# Patient Record
Sex: Female | Born: 1980 | Race: White | Hispanic: No | Smoking: Never smoker
Health system: Southern US, Community
[De-identification: ages and names within clinical notes are randomized; demographics above are authoritative.]

## PROBLEM LIST (undated history)

## (undated) DIAGNOSIS — M1990 Unspecified osteoarthritis, unspecified site: Secondary | ICD-10-CM

## (undated) DIAGNOSIS — F32A Depression, unspecified: Secondary | ICD-10-CM

## (undated) DIAGNOSIS — Z8619 Personal history of other infectious and parasitic diseases: Secondary | ICD-10-CM

## (undated) DIAGNOSIS — Z9889 Other specified postprocedural states: Secondary | ICD-10-CM

## (undated) DIAGNOSIS — T4145XA Adverse effect of unspecified anesthetic, initial encounter: Secondary | ICD-10-CM

## (undated) DIAGNOSIS — T8859XA Other complications of anesthesia, initial encounter: Secondary | ICD-10-CM

## (undated) DIAGNOSIS — R011 Cardiac murmur, unspecified: Secondary | ICD-10-CM

## (undated) DIAGNOSIS — F329 Major depressive disorder, single episode, unspecified: Secondary | ICD-10-CM

## (undated) DIAGNOSIS — R112 Nausea with vomiting, unspecified: Secondary | ICD-10-CM

## (undated) DIAGNOSIS — F419 Anxiety disorder, unspecified: Secondary | ICD-10-CM

## (undated) DIAGNOSIS — G4700 Insomnia, unspecified: Secondary | ICD-10-CM

## (undated) HISTORY — DX: Anxiety disorder, unspecified: F41.9

## (undated) HISTORY — DX: Insomnia, unspecified: G47.00

## (undated) HISTORY — DX: Depression, unspecified: F32.A

## (undated) HISTORY — DX: Major depressive disorder, single episode, unspecified: F32.9

## (undated) HISTORY — DX: Personal history of other infectious and parasitic diseases: Z86.19

## (undated) HISTORY — DX: Cardiac murmur, unspecified: R01.1

## (undated) HISTORY — DX: Unspecified osteoarthritis, unspecified site: M19.90

## (undated) HISTORY — PX: OTHER SURGICAL HISTORY: SHX169

---
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
## 2001-08-02 ENCOUNTER — Inpatient Hospital Stay (HOSPITAL_COMMUNITY): Admission: AD | Admit: 2001-08-02 | Discharge: 2001-08-02 | Payer: Self-pay | Admitting: *Deleted

## 2001-11-02 ENCOUNTER — Emergency Department (HOSPITAL_COMMUNITY): Admission: EM | Admit: 2001-11-02 | Discharge: 2001-11-02 | Payer: Self-pay | Admitting: Emergency Medicine

## 2001-11-02 ENCOUNTER — Encounter: Payer: Self-pay | Admitting: Emergency Medicine

## 2004-02-16 ENCOUNTER — Emergency Department (HOSPITAL_COMMUNITY): Admission: EM | Admit: 2004-02-16 | Discharge: 2004-02-16 | Payer: Self-pay | Admitting: Emergency Medicine

## 2006-06-18 ENCOUNTER — Inpatient Hospital Stay (HOSPITAL_COMMUNITY): Admission: AD | Admit: 2006-06-18 | Discharge: 2006-06-18 | Payer: Self-pay | Admitting: Gynecology

## 2009-11-21 ENCOUNTER — Inpatient Hospital Stay (HOSPITAL_COMMUNITY): Admission: AD | Admit: 2009-11-21 | Discharge: 2009-11-21 | Payer: Self-pay | Admitting: Obstetrics and Gynecology

## 2009-11-21 ENCOUNTER — Ambulatory Visit: Payer: Self-pay | Admitting: Family

## 2009-12-01 ENCOUNTER — Inpatient Hospital Stay (HOSPITAL_COMMUNITY): Admission: RE | Admit: 2009-12-01 | Discharge: 2009-12-01 | Payer: Self-pay | Admitting: Obstetrics and Gynecology

## 2009-12-02 ENCOUNTER — Inpatient Hospital Stay (HOSPITAL_COMMUNITY): Admission: AD | Admit: 2009-12-02 | Discharge: 2009-12-05 | Payer: Self-pay | Admitting: Obstetrics & Gynecology

## 2010-09-13 LAB — CBC
HCT: 30.5 % — ABNORMAL LOW (ref 36.0–46.0)
HCT: 30.7 % — ABNORMAL LOW (ref 36.0–46.0)
HCT: 37.6 % (ref 36.0–46.0)
Hemoglobin: 13.1 g/dL (ref 12.0–15.0)
MCHC: 34.8 g/dL (ref 30.0–36.0)
MCHC: 35.4 g/dL (ref 30.0–36.0)
MCV: 85.9 fL (ref 78.0–100.0)
MCV: 86.2 fL (ref 78.0–100.0)
MCV: 86.8 fL (ref 78.0–100.0)
Platelets: 113 10*3/uL — ABNORMAL LOW (ref 150–400)
Platelets: 127 10*3/uL — ABNORMAL LOW (ref 150–400)
Platelets: 129 10*3/uL — ABNORMAL LOW (ref 150–400)
Platelets: 99 10*3/uL — ABNORMAL LOW (ref 150–400)
RBC: 3.54 MIL/uL — ABNORMAL LOW (ref 3.87–5.11)
RBC: 4.38 MIL/uL (ref 3.87–5.11)
RDW: 13.7 % (ref 11.5–15.5)
RDW: 13.9 % (ref 11.5–15.5)
WBC: 10.4 10*3/uL (ref 4.0–10.5)
WBC: 14.1 10*3/uL — ABNORMAL HIGH (ref 4.0–10.5)
WBC: 7.5 10*3/uL (ref 4.0–10.5)

## 2010-09-13 LAB — RPR: RPR Ser Ql: NONREACTIVE

## 2010-12-16 ENCOUNTER — Encounter: Payer: Self-pay | Admitting: Family Medicine

## 2012-10-18 ENCOUNTER — Encounter: Payer: Self-pay | Admitting: Family Medicine

## 2012-10-18 DIAGNOSIS — R011 Cardiac murmur, unspecified: Secondary | ICD-10-CM | POA: Insufficient documentation

## 2012-10-19 ENCOUNTER — Ambulatory Visit (INDEPENDENT_AMBULATORY_CARE_PROVIDER_SITE_OTHER): Admitting: Physician Assistant

## 2012-10-19 ENCOUNTER — Encounter: Payer: Self-pay | Admitting: Physician Assistant

## 2012-10-19 VITALS — BP 114/66 | HR 64 | Temp 97.8°F | Resp 18 | Ht 64.0 in | Wt 134.0 lb

## 2012-10-19 DIAGNOSIS — R5381 Other malaise: Secondary | ICD-10-CM

## 2012-10-19 DIAGNOSIS — M238X1 Other internal derangements of right knee: Secondary | ICD-10-CM

## 2012-10-19 DIAGNOSIS — R29898 Other symptoms and signs involving the musculoskeletal system: Secondary | ICD-10-CM

## 2012-10-19 DIAGNOSIS — L909 Atrophic disorder of skin, unspecified: Secondary | ICD-10-CM

## 2012-10-19 DIAGNOSIS — L918 Other hypertrophic disorders of the skin: Secondary | ICD-10-CM

## 2012-10-19 DIAGNOSIS — R5383 Other fatigue: Secondary | ICD-10-CM

## 2012-10-19 DIAGNOSIS — M25569 Pain in unspecified knee: Secondary | ICD-10-CM

## 2012-10-20 LAB — CBC WITH DIFFERENTIAL/PLATELET
Basophils Relative: 1 % (ref 0–1)
HCT: 34.9 % — ABNORMAL LOW (ref 36.0–46.0)
Hemoglobin: 11.6 g/dL — ABNORMAL LOW (ref 12.0–15.0)
Lymphocytes Relative: 32 % (ref 12–46)
Lymphs Abs: 2.5 10*3/uL (ref 0.7–4.0)
MCHC: 33.2 g/dL (ref 30.0–36.0)
Monocytes Absolute: 0.4 10*3/uL (ref 0.1–1.0)
Monocytes Relative: 5 % (ref 3–12)
Neutro Abs: 4.7 10*3/uL (ref 1.7–7.7)
Neutrophils Relative %: 59 % (ref 43–77)
RBC: 4.42 MIL/uL (ref 3.87–5.11)

## 2012-10-20 LAB — COMPLETE METABOLIC PANEL WITH GFR
Albumin: 3.8 g/dL (ref 3.5–5.2)
BUN: 11 mg/dL (ref 6–23)
CO2: 27 mEq/L (ref 19–32)
Calcium: 8.5 mg/dL (ref 8.4–10.5)
Chloride: 106 mEq/L (ref 96–112)
GFR, Est Non African American: >89 mL/min
Glucose, Bld: 95 mg/dL (ref 70–99)
Potassium: 4.3 mEq/L (ref 3.5–5.3)
Sodium: 139 mEq/L (ref 135–145)
Total Protein: 6.2 g/dL (ref 6.0–8.3)

## 2012-10-20 LAB — TSH: TSH: 1.201 u[IU]/mL (ref 0.350–4.500)

## 2012-10-20 NOTE — Progress Notes (Signed)
Patient ID: Jeanne Lamb MRN: [id number], DOB: 1980-10-05, 32 y.o. Date of Encounter: 10/19/2012  Chief Complaint:  Chief Complaint  Patient presents with  . c/o bad dry skin  suspicious lesion on back  . creaky knees and crepitous  . still nursing infant son    HPI: 32 y.o. year old female  presents with multiple concerns:  1- Mole on back has gotten larger and is irritating. In past lived in [place] and had high sun exposure. Wants to check lesion  2- Right knee makes 'rice krispy' sound. Has no pain. No locking up, giving way.   3- Both calves feel achy when she lies on couch in the evening to rest. Has no cramping, just 'achy like growing pains'   4- Feels exhausted all the time despite sleeping 8 hours at night, eating very healthy, and exercising every day.  Also, doesnot understand why her weight does not decrease no matter what she does. Used to weigh 120.  5- Has h/o problem with left ear. Saw Dr. Modesto Charon here twice-treated with antibiotics x 2. Then went to ENT-'infection was gone but fluid was behind ear. They had me blow out and use bite guard." Then, say Dr Tanya Nones also. He prescribed another antibiotic. Says she just started noticing that same popping sound in left ear again and wants to check it.   Past Medical History  Diagnosis Date  . Depression   . Anxiety   . History of chicken pox   . Insomnia   . Arthritis     joint pains  . Heart murmur      Home Meds:See attached medication section for Meds entered today Current Outpatient Prescriptions on File Prior to Visit  Medication Sig Dispense Refill  . cefdinir (OMNICEF) 300 MG capsule Take 300 mg by mouth 2 (two) times daily.         No current facility-administered medications on file prior to visit.    Allergies:  Allergies  Allergen Reactions  . Minocycline     History   Social History  . Marital Status: Married    Spouse Name: N/A    Number of Children: N/A  . Years of Education: N/A    Occupational History  . Not on file.   Social History Main Topics  . Smoking status: Never Smoker   . Smokeless tobacco: Never Used  . Alcohol Use: No  . Drug Use: No  . Sexually Active: Not on file   Other Topics Concern  . Not on file   Social History Narrative  . No narrative on file    Family History  Problem Relation Age of Onset  . Heart disease Mother     MI  . Cancer Maternal Grandmother     Hodgkins Dz     Review of Systems: Constitutional: negative for chills, fever, night sweats. Positive; Weight change and fatigue  HEENT: negative for vision changes, hearing loss, congestion, rhinorrhea, ST, epistaxis, or sinus pressure Cardiovascular: negative for chest pain or palpitations. No new/increased shortness of breath or dyspnea on exertion Respiratory: negative for hemoptysis, wheezing, shortness of breath, or cough Abdominal: negative for abdominal pain, nausea, vomiting, diarrhea, or constipation Dermatological: negative for rash. Positive for concerning skin lesions Neurologic: negative for headache, dizziness, or syncope All other systems reviewed and are otherwise negative with the exception to those above and in the HPI.   Physical Exam: Blood pressure 114/66, pulse 64, temperature 97.8 F (36.6 C), temperature source Oral, resp. rate  18, height 5\' 4"  (1.626 m), weight 134 lb (60.782 kg), last menstrual period 09/25/2012., Body mass index is 22.99 kg/(m^2). General: Well developed, well nourished,WF in no acute distress. Head: Normocephalic, atraumatic, eyes without discharge, sclera non-icteric, nares are without discharge. Bilateral auditory canals clear, TM's are without perforation, pearly grey and translucent with reflective cone of light bilaterally. Oral cavity moist, posterior pharynx without exudate, erythema, peritonsillar abscess, or post nasal drip.  Neck: Supple. No thyromegaly. Full ROM. No lymphadenopathy. Lungs: Clear bilaterally to  auscultation without wheezes, rales, or rhonchi. Breathing is unlabored. Heart: RRR with S1 S2. No murmurs, rubs, or gallops. Musculoskeletal:  Strength and tone normal for age. Right knee; Inspection nml. Palpation of entire knee causes no tenderness. No laxity with valgus, varus. Negative grind test.  Extremities/Skin: Warm and dry. On her back at approx T4 level, there is a small nevus/skin tag.  Neuro: Alert and oriented X 3. Moves all extremities spontaneously. Gait is normal. CNII-XII grossly in tact. Psych:  Responds to questions appropriately with a normal affect.      ASSESSMENT AND PLAN:  32 y.o. year old female with  1. Skin tag I told her this does not appear to be c/w any type of skin cancer. She still wants it resected b/c it is irritating.She is to schedule a f/u appt to have this resected.   2. Other malaise and fatigue She ahs two small children. She does P90X every day. These may be factors of her fatigue, but will check labs to r/o underlying medical causes. - CBC with Differential - COMPLETE METABOLIC PANEL WITH GFR - TSH  3. Pain in joint, lower leg, unspecified laterality Her symptoms sound like they may be secondary to muscle fatigue with P90X. Will check potassium, calcium, thyroid etc - COMPLETE METABOLIC PANEL WITH GFR  4. Knee crepitus, right  Reassured her. She says this has been present and unchanged for years  5. Popping in left ear; Reassured her that exam is normal. Recommend decongestant.   Murray Hodgkins [place], [place], [facility] 10/20/2012 9:04 PM

## 2012-11-01 ENCOUNTER — Ambulatory Visit (INDEPENDENT_AMBULATORY_CARE_PROVIDER_SITE_OTHER): Admitting: Family Medicine

## 2012-11-01 ENCOUNTER — Encounter: Payer: Self-pay | Admitting: Family Medicine

## 2012-11-01 VITALS — BP 98/62 | HR 60 | Temp 98.1°F | Resp 14 | Wt 131.0 lb

## 2012-11-01 DIAGNOSIS — D239 Other benign neoplasm of skin, unspecified: Secondary | ICD-10-CM

## 2012-11-01 DIAGNOSIS — D229 Melanocytic nevi, unspecified: Secondary | ICD-10-CM

## 2012-11-01 NOTE — Progress Notes (Signed)
  Subjective:    Patient ID: Jeanne Lamb, female    DOB: [birth year], 31 y.o.   MRN: [id number]  HPI  Patient has a mole on her back that she requests removal. She states it is growing. He becomes frequently irritated by her bra strap. She like a biopsy.  She has no history of skin cancer. She has no family history of melanoma. Mole is possibly 4 mm. It is pink to brown in color. There are no concerning features. Past Medical History  Diagnosis Date  . Depression   . Anxiety   . History of chicken pox   . Insomnia   . Arthritis     joint pains  . Heart murmur    No current outpatient prescriptions on file prior to visit.   No current facility-administered medications on file prior to visit.   Allergies  Allergen Reactions  . Minocycline      Review of Systems  All other systems reviewed and are negative.       Objective:   Physical Exam  Cardiovascular: Normal rate and regular rhythm.   Murmur heard. Pulmonary/Chest: Effort normal and breath sounds normal. No respiratory distress. She has no wheezes. She has no rales.   4 mm pink mole in the center of her back        Assessment & Plan:  1. Nevus Reassured the patient I did not feel this cancers. She still requests excision due to irritation. Area was anesthetized with 0.1% lidocaine with epinephrine. A shave biopsy was then performed. Hemostasis was achieved with Drysol. The lesion was sent to pathology and labeled container.

## 2012-11-14 ENCOUNTER — Encounter: Payer: Self-pay | Admitting: Family Medicine

## 2012-11-21 ENCOUNTER — Encounter: Payer: Self-pay | Admitting: Family Medicine

## 2013-01-07 ENCOUNTER — Ambulatory Visit (INDEPENDENT_AMBULATORY_CARE_PROVIDER_SITE_OTHER): Admitting: Physician Assistant

## 2013-01-07 ENCOUNTER — Encounter: Payer: Self-pay | Admitting: Physician Assistant

## 2013-01-07 VITALS — BP 114/70 | HR 68 | Temp 98.4°F | Resp 18 | Wt 129.0 lb

## 2013-01-07 DIAGNOSIS — J322 Chronic ethmoidal sinusitis: Secondary | ICD-10-CM

## 2013-01-07 MED ORDER — AMOXICILLIN-POT CLAVULANATE 875-125 MG PO TABS: 1.0000 | ORAL_TABLET | Freq: Two times a day (BID) | ORAL | Status: DC

## 2013-01-07 NOTE — Progress Notes (Signed)
   Patient ID: Jeanne Lamb MRN: [id number], DOB: May 20, 1981, 32 y.o. Date of Encounter: 01/07/2013, 4:00 PM    Chief Complaint:  Chief Complaint  Patient presents with  . c/o sore throat, sinusitis x 5 weeks  now with chest cong, s     HPI: 32 y.o. year old white female reports that symptoms started 5 weeks ago. Thought it was allergies but it is persisting/getting worse. Had sore throat for 2 days. Then developed sinus pressure/congestion. Nasal drainage, sneezing, productive cough. Nose burns at times. Pain in left cheek. She gets out only small amount of mucus from nose but it is very thick. Also phlegm is very thick.    Home Meds: See attached medication section for any medications that were entered at today's visit. The computer does not put those onto this list.The following list is a list of meds entered prior to today's visit.   No current outpatient prescriptions on file prior to visit.   No current facility-administered medications on file prior to visit.    Allergies:  Allergies  Allergen Reactions  . Minocycline       Review of Systems: See HPI for pertinent ROS. All other ROS negative.    Physical Exam: Blood pressure 114/70, pulse 68, temperature 98.4 F (36.9 C), temperature source Oral, resp. rate 18, weight 129 lb (58.514 kg)., Body mass index is 22.13 kg/(m^2). General: WNWD WF.  Appears in no acute distress. HEENT: Normocephalic, atraumatic, eyes without discharge, sclera non-icteric, nares are without discharge. Bilateral auditory canals clear, TM's are without perforation, pearly grey and translucent with reflective cone of light bilaterally. Oral cavity moist, posterior pharynx without exudate, erythema, peritonsillar abscess. Left maxillary sinus is tender with percussion.Minimal tenderness with percussion of right maxillary and frontal sinuses. Neck: Supple. No thyromegaly. No lymphadenopathy. Lungs: Clear bilaterally to auscultation without wheezes,  rales, or rhonchi. Breathing is unlabored. Heart: Regular rhythm. I/VI murmur Msk:  Strength and tone normal for age. Extremities/Skin: Warm and dry. No clubbing or cyanosis. No edema. No rashes or suspicious lesions. Neuro: Alert and oriented X 3. Moves all extremities spontaneously. Gait is normal. CNII-XII grossly in tact. Psych:  Responds to questions appropriately with a normal affect.     ASSESSMENT AND PLAN:  32 y.o. year old female with  1. Ethmoid sinusitis - amoxicillin-clavulanate (AUGMENTIN) 875-125 MG per tablet; Take 1 tablet by mouth 2 (two) times daily.  Dispense: 28 tablet; Refill: 0 Mucinex DM expectorant. Also, decongestant prn. F/U if does not resolve.  Murray Hodgkins [place], [place], [facility] 01/07/2013 4:00 PM

## 2013-02-04 ENCOUNTER — Encounter: Payer: Self-pay | Admitting: Family Medicine

## 2013-02-04 ENCOUNTER — Ambulatory Visit (INDEPENDENT_AMBULATORY_CARE_PROVIDER_SITE_OTHER): Admitting: Family Medicine

## 2013-02-04 VITALS — BP 100/70 | HR 68 | Temp 97.0°F | Resp 16 | Wt 134.0 lb

## 2013-02-04 DIAGNOSIS — F32A Depression, unspecified: Secondary | ICD-10-CM

## 2013-02-04 DIAGNOSIS — F3289 Other specified depressive episodes: Secondary | ICD-10-CM

## 2013-02-04 DIAGNOSIS — F329 Major depressive disorder, single episode, unspecified: Secondary | ICD-10-CM

## 2013-02-04 DIAGNOSIS — F411 Generalized anxiety disorder: Secondary | ICD-10-CM

## 2013-02-04 DIAGNOSIS — G4700 Insomnia, unspecified: Secondary | ICD-10-CM

## 2013-02-04 MED ORDER — FLUOXETINE HCL 10 MG PO CAPS: 10.0000 mg | ORAL_CAPSULE | Freq: Every day | ORAL | Status: DC

## 2013-02-04 NOTE — Assessment & Plan Note (Signed)
Start prozac 10mg , discussed side effects, use of medication F/U 4 weeks to reasses

## 2013-02-04 NOTE — Assessment & Plan Note (Signed)
Start prozac, continue exercise routine

## 2013-02-04 NOTE — Patient Instructions (Signed)
Start prozac once a day in AM Call if you have any concerns F/U 4 weeks

## 2013-02-04 NOTE — Progress Notes (Signed)
  Subjective:    Patient ID: Jeanne Lamb, female    DOB: 1981/05/01, 32 y.o.   MRN: [id number]  HPI  Pt here with anxiety and depression symptoms,wants to start SSRI. Works as Public house manager, was in school to bridge to [employer], this has been put off until October. Has felt depressed since birth of her last son who is now 3. Her husband was deployed but has now retired from [employer] since June.She has no help caring for her 2 kids, both 3 and 6. She has no energy, feels she cant get off the couch, has no interest in any activites, does not want to play with her kids, feels hopeless. She wants to be active with her children and go back to school but feels a lot of weigh ton her shoulders. Denies crying episodes, works swing shift therefore does not sleep well.  History of depression back in 2006 when husband left for [place], was treated with meds short term at that time but can not remember what meds.  Works out on regular basis, eats mostly vegetarian  Seen a few months ago, normal bloodwork for fatigue  Review of Systems - per above  GEN- + fatigue, fever, weight loss,weakness, recent illness CVS- denies chest pain, palpitations RESP- denies SOB, cough, wheeze Neuro- denies headache, dizziness, syncope, seizure activity       Objective:   Physical Exam  GEN-NAD,alert and oriented x 3, well groomed Psych- good eye contact, a little anxious appearing, not depressed appearing, no hallucinations, no apparent SI, normal thought process  GAD 7- 13 PHQ-9- 19     Assessment & Plan:

## 2013-02-04 NOTE — Assessment & Plan Note (Signed)
Due to swing shift and depression, we will see how she does on SSRI first, may need to add something prn for sleep She agrees with plan

## 2013-03-06 ENCOUNTER — Ambulatory Visit: Admitting: Family Medicine

## 2013-03-12 ENCOUNTER — Encounter: Payer: Self-pay | Admitting: Family Medicine

## 2013-03-12 ENCOUNTER — Ambulatory Visit (INDEPENDENT_AMBULATORY_CARE_PROVIDER_SITE_OTHER): Admitting: Family Medicine

## 2013-03-12 VITALS — BP 100/60 | HR 78 | Temp 98.8°F | Resp 18 | Wt 131.0 lb

## 2013-03-12 DIAGNOSIS — F3289 Other specified depressive episodes: Secondary | ICD-10-CM

## 2013-03-12 DIAGNOSIS — F32A Depression, unspecified: Secondary | ICD-10-CM

## 2013-03-12 DIAGNOSIS — F329 Major depressive disorder, single episode, unspecified: Secondary | ICD-10-CM

## 2013-03-12 DIAGNOSIS — F411 Generalized anxiety disorder: Secondary | ICD-10-CM

## 2013-03-12 MED ORDER — FLUOXETINE HCL 10 MG PO CAPS: 10.0000 mg | ORAL_CAPSULE | Freq: Every day | ORAL | Status: DC

## 2013-03-12 NOTE — Progress Notes (Signed)
  Subjective:    Patient ID: Jeanne Lamb, female    DOB: 07-Apr-1981, 32 y.o.   MRN: [id number]  HPI  Patient here to followup anxiety and depression. At last visit 4 weeks ago she was started on fluoxetine 10 mg. She states her mood is much improved and she feels more settled however she has noticed that it decreases her appetite and she had a few vivid dreams which are now resolved. She has lost 2 pounds since the last visit but she is making herself eat on a regular basis. She's not want to change the medication because she feels well on this. She has a lot of stress at home including her husband who is suffering with PTSD from the [employer]. She also has a 28-year-old son who has a lot of emotional issues that she would like to have see a child psychologist. Her work schedule also makes it very difficult to handle the household think it her proper rest and sleep as she typically will work third shift.  Review of Systems - per above  GEN- denies fatigue, fever, weight loss,weakness, recent illness HEENT- denies eye drainage, change in vision, nasal discharge, CVS- denies chest pain, palpitations RESP- denies SOB, cough, wheeze ABD- denies N/V, change in stools, abd pain Neuro- denies headache, dizziness, syncope, seizure activity        Objective:   Physical Exam GEN-NAD,alert and oriented x 3 Psych- Normal affect and mood, not overly anxious or depressed, good eye contact, well groomed PHQ-9 score 6 , GAD score 1        Assessment & Plan:

## 2013-03-12 NOTE — Assessment & Plan Note (Signed)
Per above regarding medications 

## 2013-03-12 NOTE — Patient Instructions (Signed)
Continue prozac I will call with family and child psychologist  F/U 2 months

## 2013-03-12 NOTE — Assessment & Plan Note (Signed)
She is much improved regarding her overall anxiety. We discussed the side effects of the medications and she wishes to stay on the current dose of fluoxetine. We will followup via phone in 4 weeks and then the office in 2 months. If she continues to have problems with decreased appetite and loss of weight and we will switch the SSRI. I would try the Zoloft or Paxil next.

## 2013-03-26 ENCOUNTER — Encounter: Payer: Self-pay | Admitting: Family Medicine

## 2013-04-25 ENCOUNTER — Encounter: Payer: Self-pay | Admitting: Physician Assistant

## 2013-04-25 ENCOUNTER — Ambulatory Visit (INDEPENDENT_AMBULATORY_CARE_PROVIDER_SITE_OTHER): Admitting: Physician Assistant

## 2013-04-25 VITALS — BP 96/60 | HR 72 | Temp 98.2°F | Resp 18 | Wt 138.0 lb

## 2013-04-25 DIAGNOSIS — J329 Chronic sinusitis, unspecified: Secondary | ICD-10-CM

## 2013-04-25 MED ORDER — PREDNISONE 20 MG PO TABS: ORAL_TABLET | ORAL | Status: DC

## 2013-04-25 MED ORDER — AMOXICILLIN-POT CLAVULANATE 875-125 MG PO TABS: 1.0000 | ORAL_TABLET | Freq: Two times a day (BID) | ORAL | Status: DC

## 2013-04-25 NOTE — Progress Notes (Signed)
Patient ID: Jeanne Lamb MRN: [id number], DOB: 07/12/1980, 32 y.o. Date of Encounter: 04/25/2013, 11:16 AM    Chief Complaint:  Chief Complaint  Patient presents with  . recurrent vertigo    thick congestion in sinuses, ears hurt     HPI: 32 y.o. year old white female reports that she started feeling sick 6 days ago. Started to develop sinus pressure and nasal congestion. Had a sore throat for one day several days ago but that has improved. Feels a lot of pressure behind both of her ears. Thinks she has little bit of congestion in her upper chest and has coughed up some thick dark phlegm but most of that she thinks is from drainage. Has been taking Tylenol Sinus routinely. She notes that she had similar symptoms last year at this time. Says that she ended up seeing Dr. Modesto Charon  twice and was treated with 2 different antibiotics. After that she ended up having to go to the ENT. They really did not help much either. Says that they did audiometry testing and told her to pinch her nose and blow out. Says that she ultimately ended up coming back here and saw Dr. Tanya Nones and was treated with a different antibiotic which she says did clear up symptoms and it finally resolved.  Home Meds: See attached medication section for any medications that were entered at today's visit. The computer does not put those onto this list.The following list is a list of meds entered prior to today's visit.   Current Outpatient Prescriptions on File Prior to Visit  Medication Sig Dispense Refill  . FLUoxetine (PROZAC) 10 MG capsule Take 1 capsule (10 mg total) by mouth daily.  30 capsule  2   No current facility-administered medications on file prior to visit.    Allergies:  Allergies  Allergen Reactions  . Minocycline       Review of Systems: See HPI for pertinent ROS. All other ROS negative.    Physical Exam: Blood pressure 96/60, pulse 72, temperature 98.2 F (36.8 C), temperature source Oral, resp.  rate 18, weight 138 lb (62.596 kg)., Body mass index is 23.68 kg/(m^2). General:  WNWD WF. Appears in no acute distress. Sound very congested when she talks. HEENT: Normocephalic, atraumatic, eyes without discharge, sclera non-icteric, nares are without discharge. Bilateral auditory canals clear. Left TM is retracted (looks like crinkled 'saran wrap") Right Tm is not so retracted but I do see tiny bubble behind right TM.  Oral cavity moist, posterior pharynx without exudate, erythema, peritonsillar abscess, or post nasal drip.  Neck: Supple. No thyromegaly. No lymphadenopathy. Bilateral maxillary sinuses are tender with percussion. Lungs: Clear bilaterally to auscultation without wheezes, rales, or rhonchi. Breathing is unlabored. Heart: Regular rhythm. No murmurs, rubs, or gallops. Msk:  Strength and tone normal for age. Extremities/Skin: Warm and dry. No clubbing or cyanosis. No edema. No rashes or suspicious lesions. Neuro: Alert and oriented X 3. Moves all extremities spontaneously. Gait is normal. CNII-XII grossly in tact. Psych:  Responds to questions appropriately with a normal affect.     ASSESSMENT AND PLAN:  32 y.o. year old female with  1. Sinusitis Within the complicated course she had with the symptoms last year we'll go ahead and treat with prednisone taper in addition to antibiotic. As well a did review her chart and it was Augmentin that Dr. Tanya Nones prescribed which ultimately did resolve her symptoms at that time. She is to also continue over-the-counter decongestants. Follow up if does  not resolve. - amoxicillin-clavulanate (AUGMENTIN) 875-125 MG per tablet; Take 1 tablet by mouth 2 (two) times daily.  Dispense: 20 tablet; Refill: 0 - predniSONE (DELTASONE) 20 MG tablet; Take 3 daily for 2 days, then 2 daily for 2 days, then 1 daily for 2 days.  Dispense: 12 tablet; Refill: 0   Signed, [address] [place], [place], [facility] 04/25/2013 11:16 AM

## 2013-07-01 ENCOUNTER — Encounter: Payer: Self-pay | Admitting: Physician Assistant

## 2013-07-01 ENCOUNTER — Ambulatory Visit (INDEPENDENT_AMBULATORY_CARE_PROVIDER_SITE_OTHER): Admitting: Physician Assistant

## 2013-07-01 VITALS — BP 116/70 | HR 72 | Temp 98.0°F | Resp 18 | Wt 142.0 lb

## 2013-07-01 DIAGNOSIS — J988 Other specified respiratory disorders: Secondary | ICD-10-CM

## 2013-07-01 DIAGNOSIS — A499 Bacterial infection, unspecified: Secondary | ICD-10-CM

## 2013-07-01 DIAGNOSIS — B9689 Other specified bacterial agents as the cause of diseases classified elsewhere: Principal | ICD-10-CM

## 2013-07-01 MED ORDER — AZITHROMYCIN 250 MG PO TABS: ORAL_TABLET | ORAL | Status: DC

## 2013-07-01 MED ORDER — PREDNISONE 20 MG PO TABS: ORAL_TABLET | ORAL | Status: DC

## 2013-07-01 NOTE — Progress Notes (Signed)
Patient ID: Jeanne Lamb MRN: [id number], DOB: 02/09/1981, 33 y.o. Date of Encounter: 07/01/2013, 3:06 PM    Chief Complaint:  Chief Complaint  Patient presents with  . x 4 weeks  worsening cough, SOB, congestion    very lethargic     HPI: 33 y.o. year old white female is that she thinks her immune system is worn down secondary to stress. She has small children at home. 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 As that it was very intense at the end of the semester. Later, adds that she must get better saying this her final semester of nursing program starts back in just a few days. She saw me with a respiratory infection in October. Says that her symptoms did resolve with the treatment I gave. The time of that visit, he did review that the prior year she had respiratory infection that was very difficult to get rid of.  Today she reports that she started feeling sick on 06/11/2013. That time she was feeling short of breath with chest congestion and cough. Says that now for the past 2 days she's also having some sinus congestion. Has had no significant fevers or chills. No sore throat or earache.     Home Meds: See attached medication section for any medications that were entered at today's visit. The computer does not put those onto this list.The following list is a list of meds entered prior to today's visit.   Current Outpatient Prescriptions on File Prior to Visit  Medication Sig Dispense Refill  . pseudoephedrine-acetaminophen (TYLENOL SINUS) 30-500 MG TABS Take 1 tablet by mouth every 4 (four) hours as needed.      Marland Kitchen FLUoxetine (PROZAC) 10 MG capsule Take 1 capsule (10 mg total) by mouth daily.  30 capsule  2   No current facility-administered medications on file prior to visit.    Allergies:  Allergies  Allergen Reactions  . Minocycline       Review of Systems: See HPI for pertinent ROS. All other ROS negative.    Physical Exam: Blood pressure 116/70, pulse 72,  temperature 98 F (36.7 C), temperature source Oral, resp. rate 18, weight 142 lb (64.411 kg)., Body mass index is 24.36 kg/(m^2). General:  WNWD WF. Appears in no acute distress. HEENT: Normocephalic, atraumatic, eyes without discharge, sclera non-icteric, nares are without discharge. Bilateral auditory canals clear, TM's are without perforation, pearly grey and translucent with reflective cone of light bilaterally. Oral cavity moist, posterior pharynx without exudate, erythema, peritonsillar abscess. No sinus tenderness with percussion. Voice sounds very raspy and congested. Neck: Supple. No thyromegaly. No lymphadenopathy. Lungs: Clear bilaterally to auscultation without wheezes, rales, or rhonchi. Breathing is unlabored. Lungs are clear with no wheezes, good air movement Heart: Regular rhythm. No murmurs, rubs, or gallops. Msk:  Strength and tone normal for age. Extremities/Skin: Warm and dry. No clubbing or cyanosis. No edema. No rashes or suspicious lesions. Neuro: Alert and oriented X 3. Moves all extremities spontaneously. Gait is normal. CNII-XII grossly in tact. Psych:  Responds to questions appropriately with a normal affect.     ASSESSMENT AND PLAN:  33 y.o. year old female with  1. Bacterial respiratory infection - azithromycin (ZITHROMAX) 250 MG tablet; Day 1: Take 2 daily.  Days 2-5: Take 1 daily.  Dispense: 6 tablet; Refill: 0 - predniSONE (DELTASONE) 20 MG tablet; Take 3 daily for 2 days, then 2 daily for 2 days, then 1 daily for 2 days.  Dispense: 12  tablet; Refill: 0 Give prednisone taper given that she is so congested. I discussed the azithromycin will stay in her system and continue to work even after the last dose. However, if her symptoms are not resolving within one week after completion of antibiotics then she should followup.  Marin Olp [place], [place], [facility] 07/01/2013 3:06 PM

## 2013-09-19 ENCOUNTER — Ambulatory Visit (INDEPENDENT_AMBULATORY_CARE_PROVIDER_SITE_OTHER): Admitting: Family Medicine

## 2013-09-19 ENCOUNTER — Encounter: Payer: Self-pay | Admitting: Family Medicine

## 2013-09-19 VITALS — BP 100/60 | HR 98 | Temp 97.6°F | Resp 14 | Ht 66.0 in | Wt 145.0 lb

## 2013-09-19 DIAGNOSIS — J019 Acute sinusitis, unspecified: Secondary | ICD-10-CM

## 2013-09-19 MED ORDER — FLUTICASONE PROPIONATE 50 MCG/ACT NA SUSP: 2.0000 | Freq: Every day | NASAL | Status: DC

## 2013-09-19 MED ORDER — AMOXICILLIN-POT CLAVULANATE 875-125 MG PO TABS: 1.0000 | ORAL_TABLET | Freq: Two times a day (BID) | ORAL | Status: DC

## 2013-09-19 MED ORDER — FLUCONAZOLE 150 MG PO TABS: 150.0000 mg | ORAL_TABLET | Freq: Once | ORAL | Status: DC

## 2013-09-19 NOTE — Progress Notes (Signed)
   Subjective:    Patient ID: Jeanne Lamb, female    DOB: 08-25-1980, 33 y.o.   MRN: [id number]  HPI Patient has a history of allergies. Over the last 3 weeks she has been dealing with rhinorrhea, sneezing, sinus pressure. However over the last week she's developed fevers, left maxillary sinus pain, left ear pain, and headaches. She believes she has developed a sinus infection on top of her allergies. Past Medical History  Diagnosis Date  . Depression   . Anxiety   . History of chicken pox   . Insomnia   . Arthritis     joint pains  . Heart murmur    No current outpatient prescriptions on file prior to visit.   No current facility-administered medications on file prior to visit.   Allergies  Allergen Reactions  . Minocycline    History   Social History  . Marital Status: Married    Spouse Name: N/A    Number of Children: N/A  . Years of Education: N/A   Occupational History  . Not on file.   Social History Main Topics  . Smoking status: Never Smoker   . Smokeless tobacco: Never Used  . Alcohol Use: No  . Drug Use: No  . Sexual Activity: Not on file   Other Topics Concern  . Not on file   Social History Narrative  . No narrative on file      Review of Systems  All other systems reviewed and are negative.       Objective:   Physical Exam  Constitutional: She appears well-developed and well-nourished.  HENT:  Right Ear: Tympanic membrane, external ear and ear canal normal.  Left Ear: Tympanic membrane, external ear and ear canal normal.  Nose: Mucosal edema and rhinorrhea present. Left sinus exhibits maxillary sinus tenderness.  Mouth/Throat: Oropharynx is clear and moist. No oropharyngeal exudate.  Eyes: Conjunctivae are normal. No scleral icterus.  Neck: Neck supple.  Cardiovascular: Normal rate, regular rhythm and normal heart sounds.   No murmur heard. Pulmonary/Chest: Effort normal and breath sounds normal. No respiratory distress. She has no  wheezes. She has no rales.  Lymphadenopathy:    She has no cervical adenopathy.          Assessment & Plan:  1. Acute rhinosinusitis Begin Augmentin 875 mg by mouth twice a day for 10 days. Also gave patient a one-time dose of Diflucan in case she develops a yeast infection after antibiotics. Also recommended Flonase 2 sprays each nostril daily as a preventative in the spring and fall. - amoxicillin-clavulanate (AUGMENTIN) 875-125 MG per tablet; Take 1 tablet by mouth 2 (two) times daily.  Dispense: 20 tablet; Refill: 0 - fluconazole (DIFLUCAN) 150 MG tablet; Take 1 tablet (150 mg total) by mouth once.  Dispense: 1 tablet; Refill: 0

## 2013-11-15 ENCOUNTER — Other Ambulatory Visit

## 2013-11-15 DIAGNOSIS — F411 Generalized anxiety disorder: Secondary | ICD-10-CM

## 2013-11-15 DIAGNOSIS — Z0000 Encounter for general adult medical examination without abnormal findings: Secondary | ICD-10-CM

## 2013-11-15 LAB — LIPID PANEL
CHOL/HDL RATIO: 2.5 ratio
CHOLESTEROL: 141 mg/dL (ref 0–200)
HDL: 57 mg/dL (ref >39)
LDL Cholesterol: 76 mg/dL (ref 0–99)
Triglycerides: 41 mg/dL (ref <150)
VLDL: 8 mg/dL (ref 0–40)

## 2013-11-15 LAB — CBC WITH DIFFERENTIAL/PLATELET
BASOS PCT: 1 % (ref 0–1)
Basophils Absolute: 0.1 10*3/uL (ref 0.0–0.1)
Eosinophils Absolute: 0.2 10*3/uL (ref 0.0–0.7)
Eosinophils Relative: 2 % (ref 0–5)
HEMATOCRIT: 36.8 % (ref 36.0–46.0)
HEMOGLOBIN: 12.4 g/dL (ref 12.0–15.0)
Lymphocytes Relative: 27 % (ref 12–46)
Lymphs Abs: 2.1 10*3/uL (ref 0.7–4.0)
MCH: 26.5 pg (ref 26.0–34.0)
MCHC: 33.7 g/dL (ref 30.0–36.0)
MCV: 78.6 fL (ref 78.0–100.0)
MONO ABS: 0.5 10*3/uL (ref 0.1–1.0)
MONOS PCT: 6 % (ref 3–12)
NEUTROS ABS: 5 10*3/uL (ref 1.7–7.7)
Neutrophils Relative %: 64 % (ref 43–77)
Platelets: 239 10*3/uL (ref 150–400)
RBC: 4.68 MIL/uL (ref 3.87–5.11)
RDW: 14.3 % (ref 11.5–15.5)
WBC: 7.8 10*3/uL (ref 4.0–10.5)

## 2013-11-15 LAB — COMPLETE METABOLIC PANEL WITH GFR
ALK PHOS: 46 U/L (ref 39–117)
ALT: 14 U/L (ref 0–35)
AST: 15 U/L (ref 0–37)
Albumin: 4.5 g/dL (ref 3.5–5.2)
BUN: 12 mg/dL (ref 6–23)
CHLORIDE: 106 meq/L (ref 96–112)
CO2: 24 meq/L (ref 19–32)
Calcium: 9.1 mg/dL (ref 8.4–10.5)
Creat: 0.72 mg/dL (ref 0.50–1.10)
GFR, Est African American: >89 mL/min
GFR, Est Non African American: >89 mL/min
GLUCOSE: 83 mg/dL (ref 70–99)
POTASSIUM: 4.2 meq/L (ref 3.5–5.3)
Sodium: 139 mEq/L (ref 135–145)
TOTAL PROTEIN: 6.5 g/dL (ref 6.0–8.3)
Total Bilirubin: 0.6 mg/dL (ref 0.2–1.2)

## 2013-11-15 LAB — TSH: TSH: 1.858 u[IU]/mL (ref 0.350–4.500)

## 2013-11-16 LAB — VITAMIN D 25 HYDROXY (VIT D DEFICIENCY, FRACTURES): VIT D 25 HYDROXY: 50 ng/mL (ref 30–89)

## 2013-11-20 ENCOUNTER — Ambulatory Visit (INDEPENDENT_AMBULATORY_CARE_PROVIDER_SITE_OTHER): Admitting: Family Medicine

## 2013-11-20 ENCOUNTER — Encounter: Payer: Self-pay | Admitting: Family Medicine

## 2013-11-20 VITALS — BP 120/78 | HR 72 | Temp 98.3°F | Resp 16 | Ht 64.0 in | Wt 148.0 lb

## 2013-11-20 DIAGNOSIS — IMO0002 Reserved for concepts with insufficient information to code with codable children: Secondary | ICD-10-CM

## 2013-11-20 DIAGNOSIS — G4700 Insomnia, unspecified: Secondary | ICD-10-CM

## 2013-11-20 DIAGNOSIS — Z0000 Encounter for general adult medical examination without abnormal findings: Secondary | ICD-10-CM | POA: Insufficient documentation

## 2013-11-20 DIAGNOSIS — M1710 Unilateral primary osteoarthritis, unspecified knee: Secondary | ICD-10-CM

## 2013-11-20 LAB — C-REACTIVE PROTEIN

## 2013-11-20 LAB — SEDIMENTATION RATE: Sed Rate: 1 mm/hr (ref 0–22)

## 2013-11-20 LAB — RHEUMATOID FACTOR

## 2013-11-20 NOTE — Patient Instructions (Signed)
I recommend eye visit once a year I recommend dental visit every 6 months Goal is to  Exercise 30 minutes 5 days a week We will send a letter with lab results  Get xrays of knees  You can use Aspercreme or biofreeze for knees  Release of records- [facility] last PAP Smear  F/U as needed

## 2013-11-21 NOTE — Assessment & Plan Note (Signed)
I think her poor sleep and stress is contributing to why she can't lose weight. Discussed maybe trying melatonin. Her semester while in this week after her finals and things will settle down for her and her family.

## 2013-11-21 NOTE — Assessment & Plan Note (Signed)
She has a significant amount of stiffness and crepitus in her knees she is very young to have this much pain. I've advised topical anti-inflammatories or she can also take inflammatories by mouth which he does on occasion. We will also obtain x-rays of the knees and I will check a rheumatoid level as well as ESR here

## 2013-11-21 NOTE — Progress Notes (Signed)
Patient ID: Jeanne Lamb, female   DOB: 1980-10-13, 33 y.o.   MRN: [id number]   Subjective:    Patient ID: Jeanne Lamb, female    DOB: March 07, 1981, 33 y.o.   MRN: [id number]  Patient presents for CPE- no PAP  patient here for physical exam. She does not require Pap smears she had this done by her GYN about 6 months ago. [facility]. She had fasting labs which were reviewed. She's concerned because she's been working out significantly and does not seem to be able to lose any weight. She wants to get back down to 130s. She was concerned about her thyroid function or other metabolic imbalance. 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 She's not getting enough sleep and often skips her breakfast.  She also complains of bilateral knee pain which is been going on for the past few years. She's been diagnosed with arthritis in her knees but has never had any imaging that she can recall. She has a lot of crepitus and stiffness especially in the morning. She feels like it is getting worse. She's not had any injury in the past and she's not a runner. Her father's side of the family has significant arthritis    Review Of Systems:  GEN- denies fatigue, fever, weight loss,weakness, recent illness HEENT- denies eye drainage, change in vision, nasal discharge, CVS- denies chest pain, palpitations RESP- denies SOB, cough, wheeze ABD- denies N/V, change in stools, abd pain GU- denies dysuria, hematuria, dribbling, incontinence MSK- + joint pain, muscle aches, injury Neuro- denies headache, dizziness, syncope, seizure activity       Objective:    BP 120/78  Pulse 72  Temp(Src) 98.3 F (36.8 C) (Oral)  Resp 16  Ht 5\' 4"  (1.626 m)  Wt 148 lb (67.132 kg)  BMI 25.39 kg/m2  LMP 11/05/2013 GEN- NAD, alert and oriented x3 HEENT- PERRL, EOMI, non injected sclera, pink conjunctiva, MMM, oropharynx clear Neck- Supple, no  thyromegaly CVS- RRR, no murmur RESP-CTAB ABD-NABS,soft,NT,ND MSK- Bilat knees- no effusion, crepitus and stiff ROM, ligaments in tact  EXT- No edema Pulses- Radial, DP- 2+        Assessment & Plan:      Problem List Items Addressed This Visit   Routine general medical examination at a health care facility   Arthritis of knee - Primary   Relevant Orders      Sedimentation Rate (Completed)      Rheumatoid factor (Completed)      C-reactive protein (Completed)      DG Knee Complete 4 Views Right      DG Knee Complete 4 Views Left      Note: This dictation was prepared with Dragon dictation along with smaller phrase technology. Any transcriptional errors that result from this process are unintentional.

## 2013-11-21 NOTE — Assessment & Plan Note (Signed)
Labs reviewed and normal Immunizations UTD PAP Smear by GYN

## 2013-11-27 ENCOUNTER — Telehealth: Payer: Self-pay | Admitting: Family Medicine

## 2013-11-27 NOTE — Telephone Encounter (Signed)
Pt aware of lab results and wanted to wait until she got her BW back before getting Xrays. She will now go get the Xrays.

## 2013-12-05 ENCOUNTER — Ambulatory Visit
Admission: RE | Admit: 2013-12-05 | Discharge: 2013-12-05 | Disposition: A | Discharge status: Home / Self Care | Source: Ambulatory Visit | Attending: Family Medicine | Admitting: Family Medicine

## 2013-12-05 DIAGNOSIS — M1710 Unilateral primary osteoarthritis, unspecified knee: Secondary | ICD-10-CM

## 2013-12-06 ENCOUNTER — Other Ambulatory Visit: Payer: Self-pay | Admitting: *Deleted

## 2013-12-06 DIAGNOSIS — M2550 Pain in unspecified joint: Secondary | ICD-10-CM

## 2014-03-31 ENCOUNTER — Encounter: Payer: Self-pay | Admitting: Physician Assistant

## 2014-03-31 ENCOUNTER — Ambulatory Visit (INDEPENDENT_AMBULATORY_CARE_PROVIDER_SITE_OTHER): Admitting: Physician Assistant

## 2014-03-31 VITALS — BP 104/56 | HR 64 | Temp 98.2°F | Resp 18 | Wt 146.0 lb

## 2014-03-31 DIAGNOSIS — J0101 Acute recurrent maxillary sinusitis: Secondary | ICD-10-CM

## 2014-03-31 MED ORDER — PREDNISONE 20 MG PO TABS: ORAL_TABLET | ORAL | Status: DC

## 2014-03-31 MED ORDER — FLUCONAZOLE 150 MG PO TABS: 150.0000 mg | ORAL_TABLET | Freq: Once | ORAL | Status: DC

## 2014-03-31 MED ORDER — AMOXICILLIN-POT CLAVULANATE 875-125 MG PO TABS: 1.0000 | ORAL_TABLET | Freq: Two times a day (BID) | ORAL | Status: DC

## 2014-03-31 NOTE — Progress Notes (Signed)
Patient ID: Jeanne Lamb MRN: [id number], DOB: 03/12/1981, 33 y.o. Date of Encounter: 03/31/2014, 10:46 AM    Chief Complaint:  Chief Complaint  Patient presents with  . sinus/left ear issues since August    congestion/pressure/green secretions     HPI: 33 y.o. year old female has history of sinus infections in the past.   She says that this problem started back in August. Says that in August she went to the beach --at that time the left ear started bothering her and sinus pressure around the left ear. Since then the discomfort would come and go. Says that at one point she called here to get an appointment but it was going to be a couple of days before she could be seen -- in the meantime the symptoms improved. says one time she went to an urgent care--but  it was a several hour wait so she left without being treated. The symptoms ended up improving after that so she just never has been evaluated and treated for this current episode.  Now is having discomfort in her left ear and the sinuses and areas around the left ear. Discomfort and pressure in the left cheek and her throat feels sore. She has a lot of nasal congestion and feels very stopped up and sounds very congested when she talks.  She is using over-the-counter Vicks and Tylenol sinus with minimal relief. She is getting thick green mucus from her nose. Had no fever. No chest congestion.     Home Meds:   Outpatient Prescriptions Prior to Visit  Medication Sig Dispense Refill  . Omega-3 Fatty Acids (FISH OIL) 1200 MG CAPS Take by mouth.      Marland Kitchen BIOTIN PO Take by mouth.      . fluticasone (FLONASE) 50 MCG/ACT nasal spray Place 2 sprays into both nostrils daily.  16 g  6   No facility-administered medications prior to visit.    Allergies:  Allergies  Allergen Reactions  . Minocycline       Review of Systems: See HPI for pertinent ROS. All other ROS negative.    Physical Exam: Blood pressure 104/56, pulse 64,  temperature 98.2 F (36.8 C), temperature source Oral, resp. rate 18, weight 146 lb (66.225 kg)., Body mass index is 25.05 kg/(m^2). General: WNWD WF.  Appears in no acute distress. HEENT: Normocephalic, atraumatic, eyes without discharge, sclera non-icteric, nares are without discharge. Bilateral auditory canals clear, TM's are without perforation. Right TM normal. Left TM dull, golden.  Oral cavity moist, posterior pharynx without exudate, erythema, peritonsillar abscess. Bilateral tonsils appear inflamed with minimal erythema.Positive tenderness with percussion of left maxillary sinus and also a tenderness with percussion of frontal sinuses.  Neck: Supple. No thyromegaly. Mild tenderness with palpation of left tonsillar and cervical lymph nodes. Lungs: Clear bilaterally to auscultation without wheezes, rales, or rhonchi. Breathing is unlabored. Heart: Regular rhythm. No murmurs, rubs, or gallops. Msk:  Strength and tone normal for age. Extremities/Skin: Warm and dry.  No rashes. Neuro: Alert and oriented X 3. Moves all extremities spontaneously. Gait is normal. CNII-XII grossly in tact. Psych:  Responds to questions appropriately with a normal affect.     ASSESSMENT AND PLAN:  33 y.o. year old female with  1. Acute recurrent maxillary sinusitis - amoxicillin-clavulanate (AUGMENTIN) 875-125 MG per tablet; Take 1 tablet by mouth 2 (two) times daily.  Dispense: 20 tablet; Refill: 0 - predniSONE (DELTASONE) 20 MG tablet; Take 3 daily for 2 days, then 2 daily  for 2 days, then 1 daily for 2 days.  Dispense: 12 tablet; Refill: 0 - fluconazole (DIFLUCAN) 150 MG tablet; Take 1 tablet (150 mg total) by mouth once.  Dispense: 1 tablet; Refill: 0   She is to take the Augmentin and prednisone as directed. She will use the Diflucan if she is having symptoms of vaginal candidiasis.  she says that she is already on probiotics as continue to take probiotics. Followup if symptoms do not resolve with  completion of antibiotic and prednisone.  Signed, [address] [place], [place], [facility] 03/31/2014 10:46 AM

## 2014-06-25 ENCOUNTER — Ambulatory Visit (INDEPENDENT_AMBULATORY_CARE_PROVIDER_SITE_OTHER): Admitting: Family Medicine

## 2014-06-25 DIAGNOSIS — Z23 Encounter for immunization: Secondary | ICD-10-CM

## 2014-12-02 IMAGING — CR DG KNEE COMPLETE 4+V*R*
4 series · 4 of 4 positions shown · non-contrast
Comparison: None.

CLINICAL DATA: Intermittent knee pain, no acute trauma

EXAM:
RIGHT KNEE - COMPLETE 4+ VIEW

[t knee ap right]
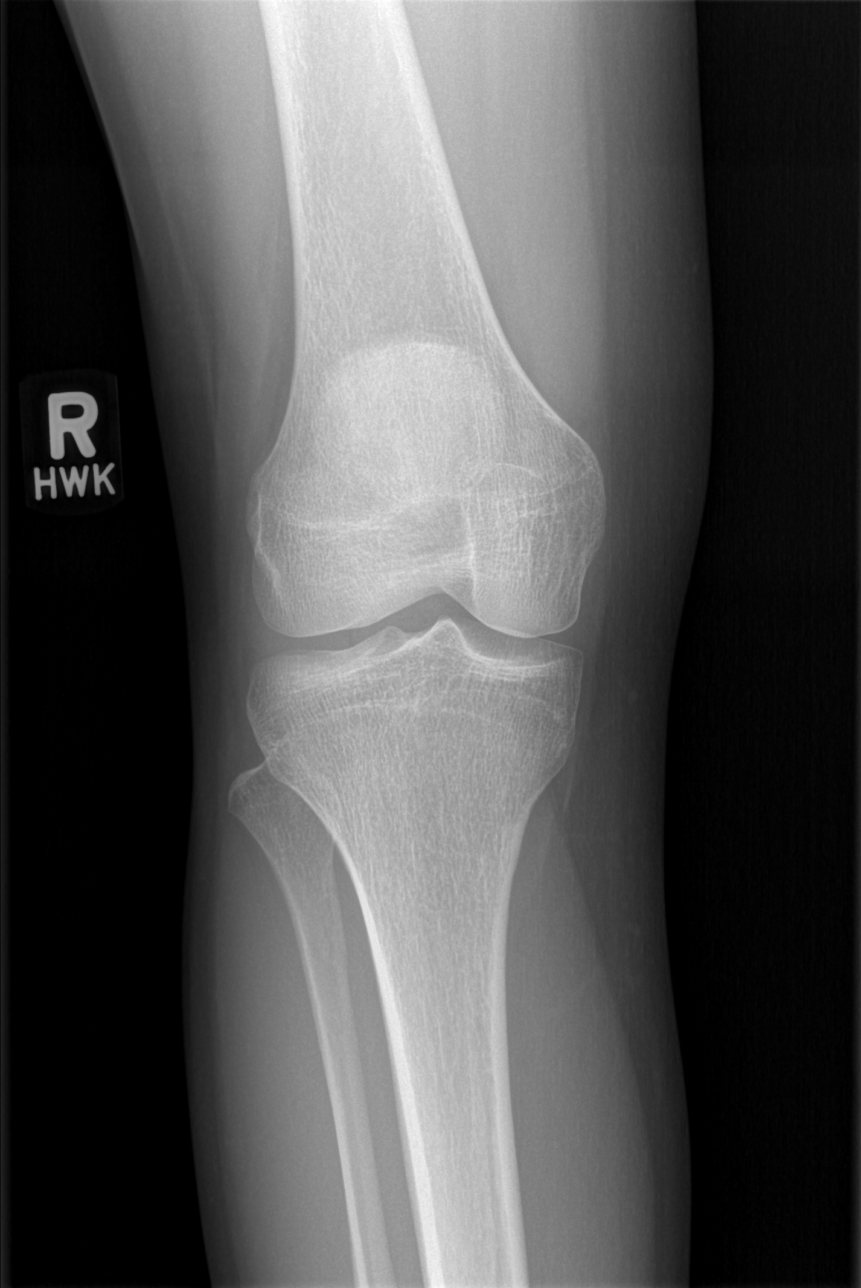

[t knee oblique right (1 of 2)]
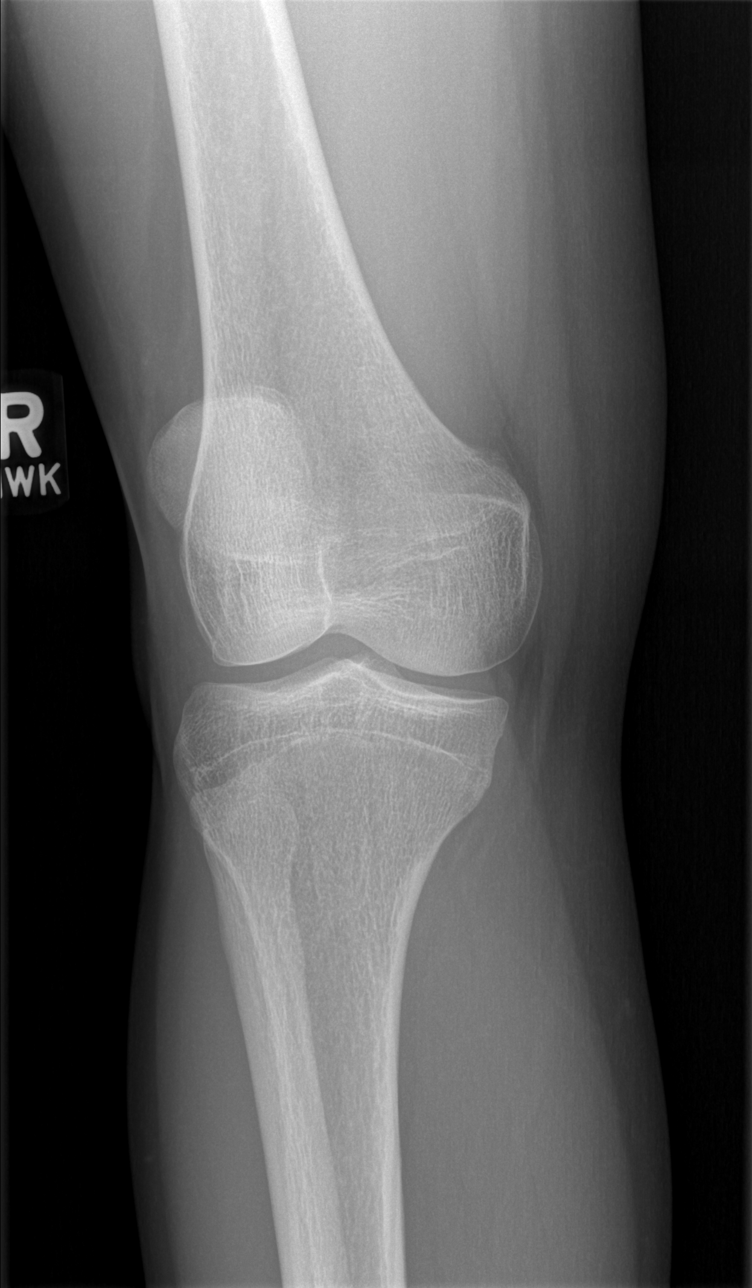

[t knee oblique right (2 of 2)]
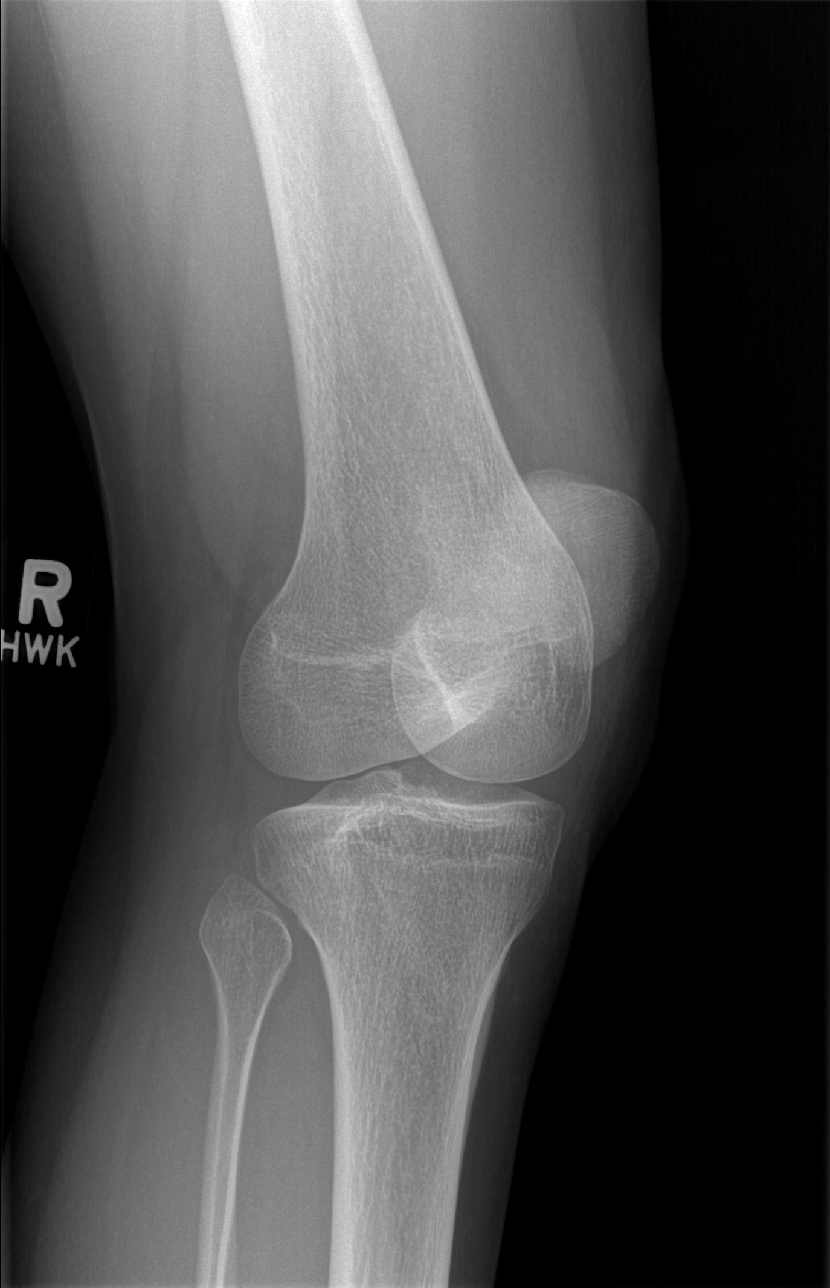

[t knee lat right]
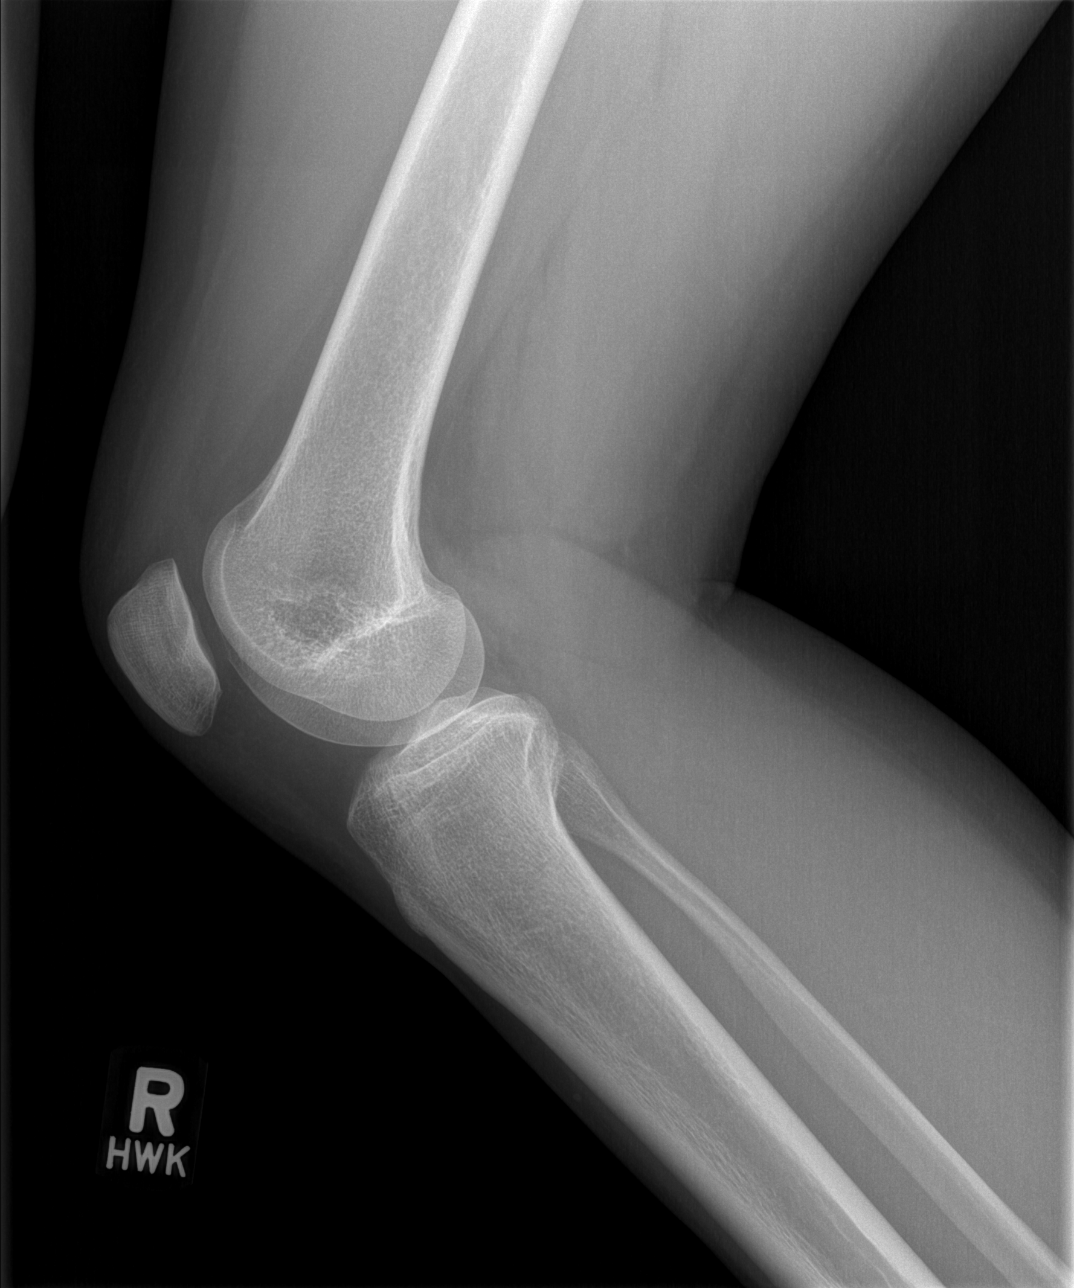

[4 of 4 positions shown; findings below may reference images not displayed]

FINDINGS: The knee joint spaces appear normal. No significant degenerative
change is seen. No fracture is noted and no joint effusion is seen.
IMPRESSION: Negative.

## 2015-02-23 ENCOUNTER — Encounter: Payer: Self-pay | Admitting: Cardiovascular Disease

## 2015-03-06 ENCOUNTER — Encounter: Payer: Self-pay | Admitting: Family Medicine

## 2015-03-06 ENCOUNTER — Ambulatory Visit (INDEPENDENT_AMBULATORY_CARE_PROVIDER_SITE_OTHER): Admitting: Family Medicine

## 2015-03-06 VITALS — BP 104/60 | HR 70 | Temp 100.2°F | Resp 16 | Ht 64.0 in | Wt 134.0 lb

## 2015-03-06 DIAGNOSIS — K529 Noninfective gastroenteritis and colitis, unspecified: Secondary | ICD-10-CM

## 2015-03-06 MED ORDER — METRONIDAZOLE 500 MG PO TABS: 500.0000 mg | ORAL_TABLET | Freq: Three times a day (TID) | ORAL | Status: DC

## 2015-03-06 MED ORDER — PROMETHAZINE HCL 12.5 MG PO TABS: 12.5000 mg | ORAL_TABLET | Freq: Three times a day (TID) | ORAL | Status: DC | PRN

## 2015-03-06 NOTE — Progress Notes (Signed)
Patient ID: Jeanne Lamb, female   DOB: 03/26/1981, 34 y.o.   MRN: [id number]   Subjective:    Patient ID: Jeanne Lamb, female    DOB: Dec 03, 1980, 34 y.o.   MRN: [id number]  Patient presents for GI Upset  patient here with diarrhea fever worsening over the past week. She started with soft stools and crampy abdominal pain on Monday to let up some but then on Wednesday came back full force associated with nausea and high fever to 101 she's been taking ibuprofen and Tylenol. She has not had any vomiting. She denies any blood in the stool but has been going upwards of 10 times a day of small diarrhea stools. 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 She has no known sick contacts at home. She denies any UTI or upper respiratory symptoms. She is drinking PD alight another fluids.    Review Of Systems:  GEN- denies fatigue, fever, weight loss,weakness, recent illness HEENT- denies eye drainage, change in vision, nasal discharge, CVS- denies chest pain, palpitations RESP- denies SOB, cough, wheeze ABD- denies N/V, +change in stools, +abd pain GU- denies dysuria, hematuria, dribbling, incontinence MSK- denies joint pain, muscle aches, injury Neuro- denies headache, dizziness, syncope, seizure activity       Objective:    BP 104/60 mmHg  Pulse 70  Temp(Src) 100.2 F (37.9 C) (Oral)  Resp 16  Ht 5\' 4"  (1.626 m)  Wt 134 lb (60.782 kg)  BMI 22.99 kg/m2  LMP 02/17/2015 (Approximate) GEN- NAD, alert and oriented x3, ill but non toxic appearing, low grade fever  HEENT- PERRL, EOMI, non injected sclera, pink conjunctiva, MMM, oropharynx clear Neck- Supple, no LAD CVS- RRR, no murmur RESP-CTAB ABD-NABS,soft,TTP lower quadrants, no rebound, no guarding, no CVA tenderness EXT- No edema Pulses- Radial 2+        Assessment & Plan:      Problem List Items Addressed This Visit    None    Visit Diagnoses    Gastroenteritis    -  Primary    Concern for gastritis however her symptoms are worsening and she is persistent fever she has had exposure to C. difficile. She was able to leave a sample for c diff testing Out of work until diarrhea resolves    Relevant Orders    Clostridium Difficile by PCR       Note: This dictation was prepared with Dragon dictation along with smaller phrase technology. Any transcriptional errors that result from this process are unintentional.

## 2015-03-06 NOTE — Patient Instructions (Signed)
Take flagyl as prescribed Also get pro-biotics Phenergan for nausea Push fluids

## 2015-03-07 LAB — CLOSTRIDIUM DIFFICILE BY PCR: CDIFFPCR: NOT DETECTED

## 2015-03-09 ENCOUNTER — Telehealth: Payer: Self-pay | Admitting: *Deleted

## 2015-03-09 ENCOUNTER — Telehealth: Payer: Self-pay | Admitting: Family Medicine

## 2015-03-09 MED ORDER — CIPROFLOXACIN HCL 500 MG PO TABS: 500.0000 mg | ORAL_TABLET | Freq: Two times a day (BID) | ORAL | Status: DC

## 2015-03-09 NOTE — Telephone Encounter (Signed)
Call placed to patient with results of stool sample.   Patient made aware and verbalized understanding.

## 2015-03-09 NOTE — Telephone Encounter (Signed)
Pt was seen by Dr. Buelah Manis on Friday 9/9 and now feels that she has a UTI. She wants to know if we can call her in a RX or if she needs to bring in a urine sample first. Please call pt @ [phone]

## 2015-03-09 NOTE — Telephone Encounter (Signed)
Per MD, no urinary Sx noted at visit.   Patient states that she has increased frequency and urgency, flank pain and burning with urination.   MD recommends Cipro BID x3 days. Prescription sent to pharmacy.

## 2015-05-07 ENCOUNTER — Encounter: Payer: Self-pay | Admitting: Physician Assistant

## 2015-05-07 ENCOUNTER — Ambulatory Visit (INDEPENDENT_AMBULATORY_CARE_PROVIDER_SITE_OTHER): Admitting: Physician Assistant

## 2015-05-07 VITALS — BP 110/60 | HR 72 | Temp 98.3°F | Resp 18 | Wt 135.0 lb

## 2015-05-07 DIAGNOSIS — L0390 Cellulitis, unspecified: Secondary | ICD-10-CM

## 2015-05-07 DIAGNOSIS — L0291 Cutaneous abscess, unspecified: Secondary | ICD-10-CM | POA: Diagnosis not present

## 2015-05-07 MED ORDER — SULFAMETHOXAZOLE-TRIMETHOPRIM 800-160 MG PO TABS: 1.0000 | ORAL_TABLET | Freq: Two times a day (BID) | ORAL | Status: DC

## 2015-05-07 MED ORDER — AMOXICILLIN-POT CLAVULANATE 875-125 MG PO TABS: 1.0000 | ORAL_TABLET | Freq: Two times a day (BID) | ORAL | Status: DC

## 2015-05-07 NOTE — Progress Notes (Signed)
    Patient ID: Jeanne Lamb MRN: [id number], DOB: 01-21-1981, 34 y.o. Date of Encounter: 05/07/2015, 4:48 PM    Chief Complaint:  Chief Complaint  Patient presents with  . infection rt index finger     HPI: 34 y.o. year old white female resents with above. Says that she is unaware of anything pricking her finger or what would have caused this. Says that this raised red area has been present for about 4 days. No other complaints or concerns. No fever or chills.     Home Meds:   Outpatient Prescriptions Prior to Visit  Medication Sig Dispense Refill  . Multiple Vitamin (MULTIVITAMIN) tablet Take 1 tablet by mouth daily.    . Omega-3 Fatty Acids (FISH OIL) 1200 MG CAPS Take by mouth.    . ciprofloxacin (CIPRO) 500 MG tablet Take 1 tablet (500 mg total) by mouth 2 (two) times daily. 6 tablet 0  . metroNIDAZOLE (FLAGYL) 500 MG tablet Take 1 tablet (500 mg total) by mouth 3 (three) times daily. 21 tablet 0  . promethazine (PHENERGAN) 12.5 MG tablet Take 1 tablet (12.5 mg total) by mouth every 8 (eight) hours as needed for nausea or vomiting. 20 tablet 0   No facility-administered medications prior to visit.    Allergies:  Allergies  Allergen Reactions  . Minocycline       Review of Systems: See HPI for pertinent ROS. All other ROS negative.    Physical Exam: Blood pressure 110/60, pulse 72, temperature 98.3 F (36.8 C), temperature source Oral, resp. rate 18, weight 135 lb (61.236 kg)., Body mass index is 23.16 kg/(m^2). General:  WNWD WF. Appears in no acute distress. Neck: Supple. No thyromegaly. No lymphadenopathy. Lungs: Clear bilaterally to auscultation without wheezes, rales, or rhonchi. Breathing is unlabored. Heart: Regular rhythm. No murmurs, rubs, or gallops. Msk:  Strength and tone normal for age. Extremities/Skin: Right 2nd finger: Just proximal to the fingernail, but distal to the DIP joint: there is 0.5cm diameter area of pink erythema and blister. Not firm  like abscess.  Neuro: Alert and oriented X 3. Moves all extremities spontaneously. Gait is normal. CNII-XII grossly in tact. Psych:  Responds to questions appropriately with a normal affect.     ASSESSMENT AND PLAN:  34 y.o. year old female with  1. Cellulitis and abscess I incised lesion with scapel but no drainage came out--only small amount of blood.  She states that she could be very early pregnant -- I recommended doing a pregnancy test but she defers but just request antibiotic that is safe to use with pregnancy in case possibly very early pregnant. Therefore using Augmentin which is listed as category B pregnancy. She is to take antibiotic as directed. Follow-up if site worsens or does not return to normal baseline in 10 days. - amoxicillin-clavulanate (AUGMENTIN) 875-125 MG tablet; Take 1 tablet by mouth 2 (two) times daily.  Dispense: 20 tablet; Refill: 0   Signed, [address] [place], [place], [facility] 05/07/2015 4:48 PM

## 2015-05-15 ENCOUNTER — Ambulatory Visit (INDEPENDENT_AMBULATORY_CARE_PROVIDER_SITE_OTHER): Admitting: Family Medicine

## 2015-05-15 ENCOUNTER — Encounter: Payer: Self-pay | Admitting: *Deleted

## 2015-05-15 ENCOUNTER — Encounter: Payer: Self-pay | Admitting: Family Medicine

## 2015-05-15 VITALS — BP 110/64 | HR 68 | Temp 98.4°F | Resp 12 | Ht 64.0 in | Wt 137.0 lb

## 2015-05-15 DIAGNOSIS — Z0000 Encounter for general adult medical examination without abnormal findings: Secondary | ICD-10-CM

## 2015-05-15 DIAGNOSIS — L03011 Cellulitis of right finger: Secondary | ICD-10-CM | POA: Diagnosis not present

## 2015-05-15 DIAGNOSIS — IMO0002 Reserved for concepts with insufficient information to code with codable children: Secondary | ICD-10-CM | POA: Insufficient documentation

## 2015-05-15 MED ORDER — SULFAMETHOXAZOLE-TRIMETHOPRIM 800-160 MG PO TABS: 1.0000 | ORAL_TABLET | Freq: Two times a day (BID) | ORAL | Status: DC

## 2015-05-15 NOTE — Progress Notes (Signed)
Patient ID: Jeanne Lamb, female   DOB: 04-Mar-1981, 34 y.o.   MRN: [id number]   Subjective:    Patient ID: Jeanne Lamb, female    DOB: 04/02/1981, 34 y.o.   MRN: [id number]  Patient presents for CPE and Paronychia  is here for complete physical exam. She has a GYN her Pap smear is up-to-date. She is due for fasting labs. She was here about a week ago secondary to. Nikki a of her index finger. She was given Augmentin the pain is improved but she still has swelling. Attempted I and D but there was no pus. She actually accidentally cut herself on a box last night on the area of the swelling but there was no drainage. She also has some redness on her left index finger and she cut herself using the knife cutting vegetables but she's not had any pus or bleeding from this either.  She was concerned about whether she needed follow-up for her a mild heart murmur. In the past she was told she had trivial mitral regurgitation echocardiogram done because of her mother's history of hypertrophic cardiomyopathy her's was normal. I reviewed this again today. He is not having shortness of breath chest pains or problems with exertion.    Review Of Systems:  GEN- denies fatigue, fever, weight loss,weakness, recent illness HEENT- denies eye drainage, change in vision, nasal discharge, CVS- denies chest pain, palpitations RESP- denies SOB, cough, wheeze ABD- denies N/V, change in stools, abd pain GU- denies dysuria, hematuria, dribbling, incontinence MSK- denies joint pain, muscle aches, injury Neuro- denies headache, dizziness, syncope, seizure activity       Objective:    BP 110/64 mmHg  Pulse 68  Temp(Src) 98.4 F (36.9 C) (Oral)  Resp 12  Ht 5\' 4"  (1.626 m)  Wt 137 lb (62.143 kg)  BMI 23.50 kg/m2 GEN- NAD, alert and oriented x3 HEENT- PERRL, EOMI, non injected sclera, pink conjunctiva, MMM, oropharynx clear Neck- Supple, no thyromegaly CVS- RRR, no  murmur RESP-CTAB ABD-NABS,soft,NT,ND Skin- Right index finger- swelling erythema at promixal nail fold, non flucutant, small laceration no drainage, no bleeding, mild TTP, mild erythema at proximal nail fold left index finger, small laceration  Above MIP  EXT- No edema Pulses- Radial, DP- 2+        Assessment & Plan:      Problem List Items Addressed This Visit    Routine general medical examination at a health care facility - Primary    CPE done, PAP UTD per GYN, immunizations, UTD, she had HIV testing done with pregnancy Return for fasting labs 2 D echonormal 2013, family history of HOCM      Paronychia    no area to I and D, start epson salt soaks, change to Bactrim BD, advised probotics with multiple rounds of antibiotics D/C augmentin      Relevant Medications   sulfamethoxazole-trimethoprim (BACTRIM DS,SEPTRA DS) 800-160 MG tablet      Note: This dictation was prepared with Dragon dictation along with smaller phrase technology. Any transcriptional errors that result from this process are unintentional.

## 2015-05-15 NOTE — Assessment & Plan Note (Signed)
CPE done, PAP UTD per GYN, immunizations, UTD, she had HIV testing done with pregnancy Return for fasting labs 2 D echonormal 2013, family history of HOCM

## 2015-05-15 NOTE — Assessment & Plan Note (Signed)
no area to I and D, start epson salt soaks, change to Bactrim BD, advised probotics with multiple rounds of antibiotics D/C augmentin

## 2015-05-15 NOTE — Addendum Note (Signed)
Addended by: Vic Blackbird F on: 05/15/2015 11:20 AM   Modules accepted: Orders

## 2015-05-15 NOTE — Patient Instructions (Signed)
Take antibiotics as prescribed Soak in Epson salt 1 tablespoon in 6 ounces of warm water - 10 minutes, 2-3 times  Come on Tuesday for  Fasting  F/U 1 year or as needed    Paronychia  Paronychia is an infection of the skin. It happens near a fingernail or toenail. It may cause pain and swelling around the nail. Usually, it is not serious and it clears up with treatment. HOME CARE  Soak the fingers or toes in warm water as told by your doctor. You may be told to do this for 20 minutes, 2-3 times a day.  Keep the area dry when you are not soaking it.  Take medicines only as told by your doctor.  If you were given an antibiotic medicine, finish all of it even if you start to feel better.  Keep the affected area clean.  Do not try to drain a fluid-filled bump yourself.  Wear rubber gloves when putting your hands in water.  Wear gloves if your hands might touch cleaners or chemicals.  Follow your doctor's instructions about:  Wound care.  Bandage (dressing) changes and removal. GET HELP IF:  Your symptoms get worse or do not improve.  You have a fever or chills.  You have redness spreading from the affected area.  You have more fluid, blood, or pus coming from the affected area.  Your finger or knuckle is swollen or is hard to move.   This information is not intended to replace advice given to you by your health care provider. Make sure you discuss any questions you have with your health care provider.   Document Released: 06/01/2009 Document Revised: 10/28/2014 Document Reviewed: 05/21/2014 Elsevier Interactive Patient Education [employer].

## 2015-05-28 ENCOUNTER — Other Ambulatory Visit: Payer: Self-pay | Admitting: *Deleted

## 2015-05-28 ENCOUNTER — Telehealth: Payer: Self-pay | Admitting: Family Medicine

## 2015-05-28 DIAGNOSIS — Z3009 Encounter for other general counseling and advice on contraception: Secondary | ICD-10-CM

## 2015-05-28 NOTE — Telephone Encounter (Signed)
Pt called and states that she forgot to tell Dr. Buelah Manis during her last OV that she would like to be put on birth control pills. Please let her know if we can call this in for her or if she will need another office visit. She uses the [facility] on [address]  [phone]

## 2015-05-28 NOTE — Telephone Encounter (Signed)
Call placed to patient.   Advised that OV is required for [facility] discussion since urine sample is required.   Patient states that she is coming in to office in AM to have labs drawn for CPE that she had on 05/15/2015.   Patient inquired if she could leave urine sample and have MD prescribe oral contraceptive, or if she still requires another OV.   MD please advise.

## 2015-05-29 ENCOUNTER — Ambulatory Visit

## 2015-05-29 DIAGNOSIS — Z3009 Encounter for other general counseling and advice on contraception: Secondary | ICD-10-CM

## 2015-05-29 LAB — COMPREHENSIVE METABOLIC PANEL
ALK PHOS: 46 U/L (ref 33–115)
ALT: 22 U/L (ref 6–29)
AST: 19 U/L (ref 10–30)
Albumin: 4.6 g/dL (ref 3.6–5.1)
BUN: 13 mg/dL (ref 7–25)
CO2: 26 mmol/L (ref 20–31)
CREATININE: 0.73 mg/dL (ref 0.50–1.10)
Calcium: 9.3 mg/dL (ref 8.6–10.2)
Chloride: 108 mmol/L (ref 98–110)
Glucose, Bld: 86 mg/dL (ref 70–99)
POTASSIUM: 4.1 mmol/L (ref 3.5–5.3)
Sodium: 138 mmol/L (ref 135–146)
TOTAL PROTEIN: 6.9 g/dL (ref 6.1–8.1)
Total Bilirubin: 0.4 mg/dL (ref 0.2–1.2)

## 2015-05-29 LAB — CBC WITH DIFFERENTIAL/PLATELET
BASOS ABS: 0.1 10*3/uL (ref 0.0–0.1)
Basophils Relative: 1 % (ref 0–1)
EOS ABS: 0.1 10*3/uL (ref 0.0–0.7)
Eosinophils Relative: 2 % (ref 0–5)
HCT: 38.5 % (ref 36.0–46.0)
HEMOGLOBIN: 12.6 g/dL (ref 12.0–15.0)
LYMPHS ABS: 2.5 10*3/uL (ref 0.7–4.0)
LYMPHS PCT: 35 % (ref 12–46)
MCH: 27.2 pg (ref 26.0–34.0)
MCHC: 32.7 g/dL (ref 30.0–36.0)
MCV: 83.2 fL (ref 78.0–100.0)
MPV: 11.2 fL (ref 8.6–12.4)
Monocytes Absolute: 0.5 10*3/uL (ref 0.1–1.0)
Monocytes Relative: 7 % (ref 3–12)
NEUTROS ABS: 3.9 10*3/uL (ref 1.7–7.7)
NEUTROS PCT: 55 % (ref 43–77)
PLATELETS: 253 10*3/uL (ref 150–400)
RBC: 4.63 MIL/uL (ref 3.87–5.11)
RDW: 14.4 % (ref 11.5–15.5)
WBC: 7.1 10*3/uL (ref 4.0–10.5)

## 2015-05-29 LAB — LIPID PANEL
CHOL/HDL RATIO: 2.4 ratio (ref <=5.0)
CHOLESTEROL: 139 mg/dL (ref 125–200)
HDL: 57 mg/dL (ref >=46)
LDL Cholesterol: 75 mg/dL (ref <130)
Triglycerides: 35 mg/dL (ref <150)
VLDL: 7 mg/dL (ref <30)

## 2015-05-29 LAB — PREGNANCY, URINE: PREG TEST UR: NEGATIVE

## 2015-05-29 LAB — TSH: TSH: 3.421 u[IU]/mL (ref 0.350–4.500)

## 2015-05-29 MED ORDER — NORGESTIM-ETH ESTRAD TRIPHASIC 0.18/0.215/0.25 MG-35 MCG PO TABS: 1.0000 | ORAL_TABLET | Freq: Every day | ORAL | Status: DC

## 2015-05-29 NOTE — Telephone Encounter (Signed)
She can leave urine sample , see if she has specific OCP she wants, if not i always prescribe generic for her insurance

## 2015-05-29 NOTE — Telephone Encounter (Addendum)
Patient prefers Ortho tri-cyclin.   Prescription sent to pharmacy after neg urine HCG result noted.

## 2015-06-01 ENCOUNTER — Encounter: Payer: Self-pay | Admitting: *Deleted

## 2015-11-24 ENCOUNTER — Encounter: Payer: Self-pay | Admitting: Family Medicine

## 2015-11-24 ENCOUNTER — Ambulatory Visit (INDEPENDENT_AMBULATORY_CARE_PROVIDER_SITE_OTHER): Payer: 59 | Admitting: Family Medicine

## 2015-11-24 VITALS — BP 108/62 | HR 68 | Temp 98.1°F | Resp 12 | Ht 64.0 in | Wt 132.0 lb

## 2015-11-24 DIAGNOSIS — A499 Bacterial infection, unspecified: Secondary | ICD-10-CM | POA: Diagnosis not present

## 2015-11-24 DIAGNOSIS — N760 Acute vaginitis: Secondary | ICD-10-CM | POA: Diagnosis not present

## 2015-11-24 DIAGNOSIS — N898 Other specified noninflammatory disorders of vagina: Secondary | ICD-10-CM | POA: Diagnosis not present

## 2015-11-24 DIAGNOSIS — R300 Dysuria: Secondary | ICD-10-CM | POA: Diagnosis not present

## 2015-11-24 DIAGNOSIS — B9689 Other specified bacterial agents as the cause of diseases classified elsewhere: Secondary | ICD-10-CM

## 2015-11-24 LAB — URINALYSIS, ROUTINE W REFLEX MICROSCOPIC
Bilirubin Urine: NEGATIVE
Glucose, UA: NEGATIVE
HGB URINE DIPSTICK: NEGATIVE
Ketones, ur: NEGATIVE
LEUKOCYTES UA: NEGATIVE
NITRITE: NEGATIVE
PH: 6 (ref 5.0–8.0)
PROTEIN: NEGATIVE
Specific Gravity, Urine: 1.01 (ref 1.001–1.035)

## 2015-11-24 LAB — WET PREP FOR TRICH, YEAST, CLUE
TRICH WET PREP: NONE SEEN
YEAST WET PREP: NONE SEEN

## 2015-11-24 MED ORDER — HYLAFEM VA SUPP: 1.0000 | Freq: Every day | VAGINAL | Status: DC

## 2015-11-24 MED ORDER — NORGESTIM-ETH ESTRAD TRIPHASIC 0.18/0.215/0.25 MG-35 MCG PO TABS: 1.0000 | ORAL_TABLET | Freq: Every day | ORAL | Status: DC

## 2015-11-24 NOTE — Progress Notes (Signed)
Patient ID: Jeanne Lamb, female   DOB: 07-29-1980, 35 y.o.   MRN: [id number]    Subjective:    Patient ID: Jeanne Lamb, female    DOB: 06-15-1981, 35 y.o.   MRN: [id number]  Patient presents for Dysuria Patient here with lower abdominal pressure  But no significant pain with urinationfor the past couple weeks. Urinary frequency when drinks more water. Mild white discharge , no real odor, minimal vaginal itching. LMP regular on OCP  No change in bowels      Review Of Systems:  GEN- denies fatigue, fever, weight loss,weakness, recent illness HEENT- denies eye drainage, change in vision, nasal discharge, CVS- denies chest pain, palpitations RESP- denies SOB, cough, wheeze ABD- denies N/V, change in stools, abd pain GU- denies dysuria, hematuria, dribbling, incontinence MSK- denies joint pain, muscle aches, injury Neuro- denies headache, dizziness, syncope, seizure activity       Objective:    BP 108/62 mmHg  Pulse 68  Temp(Src) 98.1 F (36.7 C) (Oral)  Resp 12  Ht 5\' 4"  (1.626 m)  Wt 132 lb (59.875 kg)  BMI 22.65 kg/m2  LMP 10/26/2015 (Approximate) GEN- NAD, alert and oriented x3 CVS- RRR, no murmur RESP-CTAB ABD-NABS,soft,NT,ND, no CVA tenderness GU- normal external genitalia, vaginal mucosa pink and moist, cervix visualized no growth, no blood form os, minimal thin clear discharge, no CMT, no ovarian masses, uterus normal size EXT- No edema         Assessment & Plan:      Problem List Items Addressed This Visit    None    Visit Diagnoses    Dysuria    -  Primary    Relevant Orders    Urinalysis, Routine w reflex microscopic (not at [facility]) (Completed)    Urine culture    BV (bacterial vaginosis)        Pt would like to try suppository, given sample of the Hylafem for 3 days, urine was sent for culture     Relevant Orders    WET PREP FOR [place], YEAST, CLUE       Note: This dictation was prepared with Dragon dictation along with smaller phrase  technology. Any transcriptional errors that result from this process are unintentional.

## 2015-11-24 NOTE — Patient Instructions (Signed)
F/U as previous 

## 2015-11-25 ENCOUNTER — Telehealth: Payer: Self-pay | Admitting: Family Medicine

## 2015-11-25 MED ORDER — METRONIDAZOLE 500 MG PO TABS: 500.0000 mg | ORAL_TABLET | Freq: Two times a day (BID) | ORAL | Status: DC

## 2015-11-25 NOTE — Telephone Encounter (Signed)
Patient would like to know if she can go ahead and get an antibiotic for her bv, she was seen yesterday, and got a suppository, but would like antibiotic instead, she said there is a possibility she could be pregnant so she would need to safest available  [phone] [facility]

## 2015-11-25 NOTE — Telephone Encounter (Signed)
Call placed to patient.   Reports that she is usually very regular and was due to begin menses on 11/22/2015.  Reports that she has tested 3x and all tests are negative.   MD made aware and NO given for Flagyl 500mg  BID x7 days.  Patient aware.   Prescription sent to pharmacy.

## 2015-11-25 NOTE — Telephone Encounter (Signed)
I need to know if she is pregnant or not, no indication yesterday of pregnacy Flagyl should not be used in 1st trimester

## 2015-11-25 NOTE — Telephone Encounter (Signed)
To MD

## 2015-11-26 LAB — URINE CULTURE
COLONY COUNT: NO GROWTH
ORGANISM ID, BACTERIA: NO GROWTH

## 2015-12-16 ENCOUNTER — Encounter: Admitting: Family Medicine

## 2016-01-28 DIAGNOSIS — L700 Acne vulgaris: Secondary | ICD-10-CM | POA: Diagnosis not present

## 2016-02-02 MED FILL — TRETINOIN 0.025% CREAM: 0.025 | 30 days supply | Qty: 45 | Fill #0

## 2016-04-05 MED FILL — TRETINOIN 0.025% GEL: 0.025 | 30 days supply | Qty: 45 | Fill #0

## 2016-04-26 DIAGNOSIS — Z13228 Encounter for screening for other metabolic disorders: Secondary | ICD-10-CM | POA: Diagnosis not present

## 2016-04-26 DIAGNOSIS — Z130 Encounter for screening for diseases of the blood and blood-forming organs and certain disorders involving the immune mechanism: Secondary | ICD-10-CM | POA: Diagnosis not present

## 2016-04-26 DIAGNOSIS — Z1321 Encounter for screening for nutritional disorder: Secondary | ICD-10-CM | POA: Diagnosis not present

## 2016-04-26 DIAGNOSIS — Z1329 Encounter for screening for other suspected endocrine disorder: Secondary | ICD-10-CM | POA: Diagnosis not present

## 2016-04-26 DIAGNOSIS — Z113 Encounter for screening for infections with a predominantly sexual mode of transmission: Secondary | ICD-10-CM | POA: Diagnosis not present

## 2016-04-26 DIAGNOSIS — Z131 Encounter for screening for diabetes mellitus: Secondary | ICD-10-CM | POA: Diagnosis not present

## 2016-04-26 DIAGNOSIS — Z13220 Encounter for screening for lipoid disorders: Secondary | ICD-10-CM | POA: Diagnosis not present

## 2016-04-26 DIAGNOSIS — Z6821 Body mass index (BMI) 21.0-21.9, adult: Secondary | ICD-10-CM | POA: Diagnosis not present

## 2016-04-26 DIAGNOSIS — Z01419 Encounter for gynecological examination (general) (routine) without abnormal findings: Secondary | ICD-10-CM | POA: Diagnosis not present

## 2016-05-09 DIAGNOSIS — N906 Unspecified hypertrophy of vulva: Secondary | ICD-10-CM | POA: Diagnosis not present

## 2016-05-23 DIAGNOSIS — N72 Inflammatory disease of cervix uteri: Secondary | ICD-10-CM | POA: Diagnosis not present

## 2016-05-23 DIAGNOSIS — N870 Mild cervical dysplasia: Secondary | ICD-10-CM | POA: Diagnosis not present

## 2016-05-23 DIAGNOSIS — N879 Dysplasia of cervix uteri, unspecified: Secondary | ICD-10-CM | POA: Diagnosis not present

## 2016-05-23 DIAGNOSIS — Z3202 Encounter for pregnancy test, result negative: Secondary | ICD-10-CM | POA: Diagnosis not present

## 2016-06-06 MED FILL — IBUPROFEN 800 MG TABLET: 800 | 6 days supply | Qty: 20 | Fill #0

## 2016-06-09 DIAGNOSIS — N9069 Other specified hypertrophy of vulva: Secondary | ICD-10-CM | POA: Diagnosis not present

## 2016-06-09 DIAGNOSIS — N906 Unspecified hypertrophy of vulva: Secondary | ICD-10-CM | POA: Diagnosis not present

## 2016-06-09 DIAGNOSIS — Z3202 Encounter for pregnancy test, result negative: Secondary | ICD-10-CM | POA: Diagnosis not present

## 2016-06-09 MED FILL — ONDANSETRON ODT 4 MG TABLET: 4 | 3 days supply | Qty: 15 | Fill #0

## 2016-06-09 MED FILL — OXYCODONE W/APAP 5/325 TAB: 5-325 | 3 days supply | Qty: 20 | Fill #0

## 2016-07-22 MED FILL — TRETINOIN 0.025% GEL: 0.025 | 30 days supply | Qty: 45 | Fill #1

## 2016-07-25 ENCOUNTER — Encounter: Payer: Self-pay | Admitting: Physician Assistant

## 2016-07-25 ENCOUNTER — Ambulatory Visit (INDEPENDENT_AMBULATORY_CARE_PROVIDER_SITE_OTHER): Payer: 59 | Admitting: Physician Assistant

## 2016-07-25 ENCOUNTER — Encounter: Payer: Self-pay | Admitting: Family Medicine

## 2016-07-25 VITALS — BP 100/68 | HR 72 | Temp 98.0°F | Resp 14 | Wt 126.8 lb

## 2016-07-25 DIAGNOSIS — R52 Pain, unspecified: Secondary | ICD-10-CM

## 2016-07-25 DIAGNOSIS — J988 Other specified respiratory disorders: Secondary | ICD-10-CM

## 2016-07-25 DIAGNOSIS — B9689 Other specified bacterial agents as the cause of diseases classified elsewhere: Secondary | ICD-10-CM

## 2016-07-25 LAB — INFLUENZA A AND B AG, IMMUNOASSAY
INFLUENZA A ANTIGEN: NOT DETECTED
Influenza B Antigen: NOT DETECTED

## 2016-07-25 MED ORDER — AZITHROMYCIN 250 MG PO TABS: ORAL_TABLET | ORAL | 0 refills | Status: DC

## 2016-07-25 MED FILL — AZITHROMYCIN 250 MG TABLET: 250 | 5 days supply | Qty: 6 | Fill #0

## 2016-07-25 NOTE — Progress Notes (Signed)
Patient ID: Jeanne Lamb MRN: [id number], DOB: 1981/03/16, 36 y.o. Date of Encounter: 07/25/2016, 1:38 PM    Chief Complaint:  Chief Complaint  Patient presents with  . Sore Throat    x 2days  . Generalized Body Aches  . Cough  . Nasal Congestion  . Nausea     HPI: 36 y.o. year old female presents with above.   Says that she initially started getting sick back around January 14 and at that time felt like she had bronchitis. She is now working as an Therapist, sports at [employer] that lots of patients and coworkers have all been sick with flu and other viruses etc. Says that she already had this cough and chest congestion but then the last couple of days also developed body aches and some runny nose. Today developed sinus pressure as well and isn't sure she may be developing a sinus infection (has history of sinusitis in the past)     Home Meds:   Outpatient Medications Prior to Visit  Medication Sig Dispense Refill  . Multiple Vitamin (MULTIVITAMIN) tablet Take 1 tablet by mouth daily.    . Norgestimate-Ethinyl Estradiol Triphasic (ORTHO TRI-CYCLEN, 28,) 0.18/0.215/0.25 MG-35 MCG tablet Take 1 tablet by mouth daily. 1 Package 11  . Homeopathic Products (HYLAFEM) SUPP Place 1 suppository vaginally daily. (Patient not taking: Reported on 07/25/2016) 3 suppository 0  . metroNIDAZOLE (FLAGYL) 500 MG tablet Take 1 tablet (500 mg total) by mouth 2 (two) times daily. (Patient not taking: Reported on 07/25/2016) 14 tablet 0  . Omega-3 Fatty Acids (FISH OIL) 1200 MG CAPS Take by mouth.     No facility-administered medications prior to visit.     Allergies:  Allergies  Allergen Reactions  . Minocycline       Review of Systems: See HPI for pertinent ROS. All other ROS negative.    Physical Exam: Blood pressure 100/68, pulse 72, temperature 98 F (36.7 C), temperature source Oral, resp. rate 14, weight 126 lb 12.8 oz (57.5 kg), last menstrual period 07/12/2016, SpO2 99  %., Body mass index is 21.77 kg/m. General:  WNWD WF. Appears in no acute distress. HEENT: Normocephalic, atraumatic, eyes without discharge, sclera non-icteric, nares are without discharge. Bilateral auditory canals clear, TM's are without perforation, pearly grey and translucent with reflective cone of light bilaterally. Oral cavity moist, posterior pharynx without exudate, erythema, peritonsillar abscess. No tenderness with percussion to frontal or maxillary sinuses bilaterally.  Neck: Supple. No thyromegaly. No lymphadenopathy. Lungs: Clear bilaterally to auscultation without wheezes, rales, or rhonchi. Breathing is unlabored. Heart: Regular rhythm. No murmurs, rubs, or gallops. Msk:  Strength and tone normal for age. Extremities/Skin: Warm and dry.  Neuro: Alert and oriented X 3. Moves all extremities spontaneously. Gait is normal. CNII-XII grossly in tact. Psych:  Responds to questions appropriately with a normal affect.   Results for orders placed or performed in visit on 07/25/16  Influenza A and B Ag, Immunoassay  Result Value Ref Range   Source: NASAL SWAB    Influenza A Antigen Not Detected Not Detected   Influenza B Antigen Not Detected Not Detected     ASSESSMENT AND PLAN:  36 y.o. year old female with  1. Bacterial respiratory infection Start antibiotics today and take as directed. Follow-up if symptoms do not resolve within a week after completion of antibiotic. 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 Will plan for her  to return to work Wednesday. Follow-up if needed. - azithromycin (ZITHROMAX) 250 MG tablet; Day 1: Take 2 daily. Days 2 - 5: Take 1 daily.  Dispense: 6 tablet; Refill: 0  2. Body aches - Influenza A and B Ag, Immunoassay   Signed, [address] [place], [place], [facility] 07/25/2016 1:38 PM

## 2016-08-18 DIAGNOSIS — O209 Hemorrhage in early pregnancy, unspecified: Secondary | ICD-10-CM | POA: Diagnosis not present

## 2016-08-18 DIAGNOSIS — N939 Abnormal uterine and vaginal bleeding, unspecified: Secondary | ICD-10-CM | POA: Diagnosis not present

## 2016-12-21 MED FILL — TRETINOIN 0.025% GEL: 0.025 | 30 days supply | Qty: 45 | Fill #2

## 2017-02-28 DIAGNOSIS — N911 Secondary amenorrhea: Secondary | ICD-10-CM | POA: Diagnosis not present

## 2017-02-28 MED FILL — DICLEGIS DR 10-10 MG TABLET: 10-10 | 30 days supply | Qty: 120 | Fill #0

## 2017-03-16 DIAGNOSIS — Z3685 Encounter for antenatal screening for Streptococcus B: Secondary | ICD-10-CM | POA: Diagnosis not present

## 2017-03-16 DIAGNOSIS — Z3481 Encounter for supervision of other normal pregnancy, first trimester: Secondary | ICD-10-CM | POA: Diagnosis not present

## 2017-03-16 LAB — OB RESULTS CONSOLE ABO/RH: RH TYPE: POSITIVE

## 2017-03-16 LAB — OB RESULTS CONSOLE GC/CHLAMYDIA
Chlamydia: NEGATIVE
Gonorrhea: NEGATIVE

## 2017-03-16 LAB — OB RESULTS CONSOLE RPR: RPR: NONREACTIVE

## 2017-03-16 LAB — OB RESULTS CONSOLE HIV ANTIBODY (ROUTINE TESTING): HIV: NONREACTIVE

## 2017-03-16 LAB — OB RESULTS CONSOLE HEPATITIS B SURFACE ANTIGEN: Hepatitis B Surface Ag: NEGATIVE

## 2017-03-16 LAB — OB RESULTS CONSOLE RUBELLA ANTIBODY, IGM: RUBELLA: IMMUNE

## 2017-03-16 LAB — OB RESULTS CONSOLE ANTIBODY SCREEN: ANTIBODY SCREEN: NEGATIVE

## 2017-03-21 DIAGNOSIS — Z3480 Encounter for supervision of other normal pregnancy, unspecified trimester: Secondary | ICD-10-CM | POA: Diagnosis not present

## 2017-03-21 DIAGNOSIS — Z3481 Encounter for supervision of other normal pregnancy, first trimester: Secondary | ICD-10-CM | POA: Diagnosis not present

## 2017-04-05 DIAGNOSIS — Z3A12 12 weeks gestation of pregnancy: Secondary | ICD-10-CM | POA: Diagnosis not present

## 2017-04-05 DIAGNOSIS — Z3682 Encounter for antenatal screening for nuchal translucency: Secondary | ICD-10-CM | POA: Diagnosis not present

## 2017-04-05 DIAGNOSIS — O09521 Supervision of elderly multigravida, first trimester: Secondary | ICD-10-CM | POA: Diagnosis not present

## 2017-04-05 DIAGNOSIS — Z3683 Encounter for fetal screening for congenital cardiac abnormalities: Secondary | ICD-10-CM | POA: Diagnosis not present

## 2017-04-19 DIAGNOSIS — Z3400 Encounter for supervision of normal first pregnancy, unspecified trimester: Secondary | ICD-10-CM | POA: Diagnosis not present

## 2017-04-19 DIAGNOSIS — R87612 Low grade squamous intraepithelial lesion on cytologic smear of cervix (LGSIL): Secondary | ICD-10-CM | POA: Diagnosis not present

## 2017-06-02 DIAGNOSIS — Z363 Encounter for antenatal screening for malformations: Secondary | ICD-10-CM | POA: Diagnosis not present

## 2017-06-02 DIAGNOSIS — O09522 Supervision of elderly multigravida, second trimester: Secondary | ICD-10-CM | POA: Diagnosis not present

## 2017-06-02 DIAGNOSIS — Z3400 Encounter for supervision of normal first pregnancy, unspecified trimester: Secondary | ICD-10-CM | POA: Diagnosis not present

## 2017-06-02 DIAGNOSIS — Z3A21 21 weeks gestation of pregnancy: Secondary | ICD-10-CM | POA: Diagnosis not present

## 2017-06-07 ENCOUNTER — Other Ambulatory Visit (HOSPITAL_COMMUNITY): Payer: Self-pay | Admitting: Obstetrics & Gynecology

## 2017-06-07 DIAGNOSIS — O35EXX0 Maternal care for other (suspected) fetal abnormality and damage, fetal genitourinary anomalies, not applicable or unspecified: Secondary | ICD-10-CM

## 2017-06-07 DIAGNOSIS — O358XX0 Maternal care for other (suspected) fetal abnormality and damage, not applicable or unspecified: Secondary | ICD-10-CM

## 2017-06-13 ENCOUNTER — Encounter (HOSPITAL_COMMUNITY): Payer: Self-pay | Admitting: *Deleted

## 2017-06-16 ENCOUNTER — Ambulatory Visit (HOSPITAL_COMMUNITY)
Admission: RE | Admit: 2017-06-16 | Discharge: 2017-06-16 | Disposition: A | Discharge status: Home / Self Care | Payer: 59 | Source: Ambulatory Visit | Attending: Obstetrics & Gynecology | Admitting: Obstetrics & Gynecology

## 2017-06-16 ENCOUNTER — Other Ambulatory Visit (HOSPITAL_COMMUNITY): Payer: Self-pay | Admitting: *Deleted

## 2017-06-16 ENCOUNTER — Encounter (HOSPITAL_COMMUNITY): Payer: Self-pay

## 2017-06-16 ENCOUNTER — Other Ambulatory Visit (HOSPITAL_COMMUNITY): Payer: Self-pay | Admitting: Obstetrics & Gynecology

## 2017-06-16 ENCOUNTER — Other Ambulatory Visit: Payer: Self-pay

## 2017-06-16 DIAGNOSIS — Z363 Encounter for antenatal screening for malformations: Secondary | ICD-10-CM | POA: Insufficient documentation

## 2017-06-16 DIAGNOSIS — Z3A23 23 weeks gestation of pregnancy: Secondary | ICD-10-CM | POA: Insufficient documentation

## 2017-06-16 DIAGNOSIS — O358XX0 Maternal care for other (suspected) fetal abnormality and damage, not applicable or unspecified: Secondary | ICD-10-CM | POA: Diagnosis not present

## 2017-06-16 DIAGNOSIS — O35EXX0 Maternal care for other (suspected) fetal abnormality and damage, fetal genitourinary anomalies, not applicable or unspecified: Secondary | ICD-10-CM

## 2017-06-16 DIAGNOSIS — O09522 Supervision of elderly multigravida, second trimester: Secondary | ICD-10-CM | POA: Insufficient documentation

## 2017-06-16 DIAGNOSIS — N1339 Other hydronephrosis: Secondary | ICD-10-CM

## 2017-06-16 NOTE — Consult Note (Signed)
Maternal Fetal Medicine Consultation  Requesting Provider(s): [facility]  Primary OB: [facility] Reason for consultation: Fetal urinary tract dilation  HPI: I had the pleasure of seeing your patient Jeanne Lamb for Maternal-Fetal Medicine consultation on 06/16/2017. As you know, Jeanne Lamb is a 36 y.o. G3P2002 at [redacted]w[redacted]d who presents for consultation regarding newly diagnosed bilateral fetal urinary tract dilation. This pregnancy has been otherwise uncomplicated until anatomy ultrasound at [redacted]w[redacted]d revealed bilateral urinary tract dilation with the left renal pelvis measuring 72mm and the right 42mm. Genetic screening with cell-free DNA showed a normal female.  Jeanne Lamb's obstetric history is notable for two uncomplicated term cesarean deliveries. Jeanne Lamb has no known family history of congenital renal anomalies or other anomalies. 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   OB History: OB History    Gravida Para Term Preterm AB Living   3 2 2     2    SAB TAB Ectopic Multiple Live Births                  PMH:  Past Medical History:  Diagnosis Date  . Anxiety   . Arthritis    joint pains  . Depression   . Heart murmur   . History of chicken pox   . Insomnia     PSH:  Past Surgical History:  Procedure Laterality Date  . 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 All other systems reviewed and are negative.  Please see separate document for fetal ultrasound report.  A/P: Jeanne Lamb is a 36 y.o. G3P2002 at [redacted]w[redacted]d with confirmed diagnosis of bilateral urinary tract dilation, A1 on the right, A2-3 on the left. I discussed today's ultrasound findings with Jeanne Lamb. We reviewed the varied etiology of urinary tract dilation including ureteropelvic or ureterovesicular obstruction, duplicated collecting  system. We also discussed the association with Trisomy 21, but that given negative cell-free DNA screening and no other associated anomalies, the risk for Trisomy 21 is low. We discussed the reassuring nature of normal amniotic fluid volume suggesting normal renal function. We emphasized a plan for consultation with pediatric urology and ultrasounds every 4 week to reassess renal anatomy. We discussed that most children are followed with serial ultrasounds and some with conservative therapy with prophylactic antibiotics. Surgical intervention may be indicated in select cases.  Jeanne Lamb  will continue to follow with you for routine prenatal care. We discussed that the fetal status should not affect timing or mode of delivery.   Following our discussion Jeanne Lamb expressed understanding and indicated that all her questions were answered.  Assessment: Left urinary tract dilation class W4-6, duplicated left collecting system, right urinary tract dilation class A1  Plan:  1. Repeat ultrasound and pediatric urology consultation in 4 weeks 2. Delivery at term unless obstetric indications arise  Thank you for the opportunity to be a part of the care of [place]. Please contact our office if we can be of further assistance.   I spent approximately 40 minutes with this patient with over 50% of time spent in face-to-face counseling.  Abram Sander, MD Maternal-Fetal Medicine

## 2017-07-05 DIAGNOSIS — Z3480 Encounter for supervision of other normal pregnancy, unspecified trimester: Secondary | ICD-10-CM | POA: Diagnosis not present

## 2017-07-05 DIAGNOSIS — Z3400 Encounter for supervision of normal first pregnancy, unspecified trimester: Secondary | ICD-10-CM | POA: Diagnosis not present

## 2017-07-05 DIAGNOSIS — Z23 Encounter for immunization: Secondary | ICD-10-CM | POA: Diagnosis not present

## 2017-07-20 DIAGNOSIS — Z3689 Encounter for other specified antenatal screening: Secondary | ICD-10-CM | POA: Diagnosis not present

## 2017-07-20 DIAGNOSIS — O3429 Maternal care due to uterine scar from other previous surgery: Secondary | ICD-10-CM | POA: Diagnosis not present

## 2017-07-20 DIAGNOSIS — O09523 Supervision of elderly multigravida, third trimester: Secondary | ICD-10-CM | POA: Diagnosis not present

## 2017-07-20 DIAGNOSIS — O358XX0 Maternal care for other (suspected) fetal abnormality and damage, not applicable or unspecified: Secondary | ICD-10-CM | POA: Diagnosis not present

## 2017-07-20 DIAGNOSIS — Z3A28 28 weeks gestation of pregnancy: Secondary | ICD-10-CM | POA: Diagnosis not present

## 2017-07-20 DIAGNOSIS — O283 Abnormal ultrasonic finding on antenatal screening of mother: Secondary | ICD-10-CM | POA: Diagnosis not present

## 2017-08-11 ENCOUNTER — Encounter (HOSPITAL_COMMUNITY): Payer: Self-pay

## 2017-08-11 ENCOUNTER — Ambulatory Visit (HOSPITAL_COMMUNITY)
Admission: RE | Admit: 2017-08-11 | Discharge: 2017-08-11 | Disposition: A | Discharge status: Home / Self Care | Payer: 59 | Source: Ambulatory Visit | Attending: Obstetrics & Gynecology | Admitting: Obstetrics & Gynecology

## 2017-08-11 ENCOUNTER — Other Ambulatory Visit (HOSPITAL_COMMUNITY): Payer: Self-pay | Admitting: Maternal and Fetal Medicine

## 2017-08-11 DIAGNOSIS — O358XX0 Maternal care for other (suspected) fetal abnormality and damage, not applicable or unspecified: Secondary | ICD-10-CM

## 2017-08-11 DIAGNOSIS — Z362 Encounter for other antenatal screening follow-up: Secondary | ICD-10-CM

## 2017-08-11 DIAGNOSIS — O35EXX0 Maternal care for other (suspected) fetal abnormality and damage, fetal genitourinary anomalies, not applicable or unspecified: Secondary | ICD-10-CM

## 2017-08-11 DIAGNOSIS — Z3A31 31 weeks gestation of pregnancy: Secondary | ICD-10-CM | POA: Insufficient documentation

## 2017-08-11 DIAGNOSIS — O09523 Supervision of elderly multigravida, third trimester: Secondary | ICD-10-CM | POA: Diagnosis not present

## 2017-08-11 DIAGNOSIS — N1339 Other hydronephrosis: Secondary | ICD-10-CM

## 2017-08-11 DIAGNOSIS — O403XX0 Polyhydramnios, third trimester, not applicable or unspecified: Secondary | ICD-10-CM | POA: Diagnosis not present

## 2017-08-15 ENCOUNTER — Ambulatory Visit: Payer: 59

## 2017-08-15 ENCOUNTER — Ambulatory Visit (INDEPENDENT_AMBULATORY_CARE_PROVIDER_SITE_OTHER): Payer: 59 | Admitting: Family Medicine

## 2017-08-15 ENCOUNTER — Encounter: Payer: Self-pay | Admitting: Family Medicine

## 2017-08-15 VITALS — BP 100/60 | HR 84 | Temp 98.2°F | Resp 14 | Ht 66.0 in | Wt 160.0 lb

## 2017-08-15 DIAGNOSIS — Z20828 Contact with and (suspected) exposure to other viral communicable diseases: Secondary | ICD-10-CM

## 2017-08-15 LAB — INFLUENZA A AND B AG, IMMUNOASSAY
INFLUENZA A ANTIGEN: NOT DETECTED
INFLUENZA B ANTIGEN: NOT DETECTED

## 2017-08-15 MED ORDER — OSELTAMIVIR PHOSPHATE 75 MG PO CAPS
75.0000 mg | ORAL_CAPSULE | Freq: Every day | ORAL | 0 refills | Status: DC
Start: 2017-08-15 — End: 2017-10-03

## 2017-08-15 MED FILL — OSELTAMIVIR PHOSPHATE 75 MG: 75 | 14 days supply | Qty: 14 | Fill #0

## 2017-08-15 NOTE — Progress Notes (Signed)
Subjective:    Patient ID: Jeanne Lamb, female    DOB: 1981/04/09, 37 y.o.   MRN: [id number]  HPI Patient is a 37 year old white female who is [redacted] weeks pregnant.  She works as a Marine scientist.  She has been exposed to the flu and her occupation.  Sunday developed a dull headache.  Yesterday developed malaise and fatigue and body aches although she did work 13-hour shift.  She denies any fever.  She does have some head congestion.  She denies any otalgia.  She denies any shortness of breath.  She denies any cough.  She denies any sinus pain.  She denies any nausea or vomiting or diarrhea.  She reports normal movement in her baby. Past Medical History:  Diagnosis Date  . Anxiety   . Arthritis    joint pains  . Depression   . Heart murmur   . History of chicken pox   . Insomnia    Past Surgical History:  Procedure Laterality Date  . CESAREAN SECTION     Current Outpatient Medications on File Prior to Visit  Medication Sig Dispense Refill  . Prenatal Vit-Fe Fumarate-FA (PRENATAL VITAMIN PO) Take by mouth.    . Homeopathic Products (HYLAFEM) SUPP Place 1 suppository vaginally daily. (Patient not taking: Reported on 07/25/2016) 3 suppository 0  . Multiple Vitamin (MULTIVITAMIN) tablet Take 1 tablet by mouth daily.    . Norgestimate-Ethinyl Estradiol Triphasic (ORTHO TRI-CYCLEN, 28,) 0.18/0.215/0.25 MG-35 MCG tablet Take 1 tablet by mouth daily. (Patient not taking: Reported on 06/16/2017) 1 Package 11  . Omega-3 Fatty Acids (FISH OIL) 1200 MG CAPS Take by mouth.     No current facility-administered medications on file prior to visit.    Allergies  Allergen Reactions  . Minocycline    Social History   Socioeconomic History  . Marital status: Married    Spouse name: Not on file  . Number of children: Not on file  . Years of education: Not on file  . Highest education level: Not on file  Social Needs  . Financial resource strain: Not on file  . Food insecurity - worry: Not on file    . Food insecurity - inability: Not on file  . Transportation needs - medical: Not on file  . Transportation needs - non-medical: Not on file  Occupational History  . Not on file  Tobacco Use  . Smoking status: Never Smoker  . Smokeless tobacco: Never Used  Substance and Sexual Activity  . Alcohol use: No  . Drug use: No  . Sexual activity: Not on file  Other Topics Concern  . Not on file  Social History Narrative  . Not on file      Review of Systems  All other systems reviewed and are negative.      Objective:   Physical Exam  Constitutional: She appears well-developed and well-nourished. No distress.  HENT:  Right Ear: External ear normal.  Left Ear: External ear normal.  Nose: Nose normal.  Mouth/Throat: Oropharynx is clear and moist. No oropharyngeal exudate.  Eyes: Conjunctivae are normal.  Neck: Neck supple.  Cardiovascular: Normal rate, regular rhythm and normal heart sounds.  No murmur heard. Pulmonary/Chest: Effort normal and breath sounds normal. No respiratory distress. She has no wheezes. She has no rales. She exhibits no tenderness.  Lymphadenopathy:    She has no cervical adenopathy.  Skin: She is not diaphoretic.  Vitals reviewed.         Assessment & Plan:  Exposure to the flu - Plan: Influenza A and B Ag, Immunoassay  Flu test today is negative.  Patient may have another virus but her exam is reassuring.  She was still to like to take Tamiflu 75 mg daily for prophylaxis.  We discussed the pregnancy implications.  This medication does cross the placenta but is generally felt to be safe in pregnancy due to no observed negative outcomes.  Patient is willing to accept the risk.

## 2017-08-18 DIAGNOSIS — O3663X0 Maternal care for excessive fetal growth, third trimester, not applicable or unspecified: Secondary | ICD-10-CM | POA: Diagnosis not present

## 2017-08-18 DIAGNOSIS — Z3400 Encounter for supervision of normal first pregnancy, unspecified trimester: Secondary | ICD-10-CM | POA: Diagnosis not present

## 2017-08-18 DIAGNOSIS — O26893 Other specified pregnancy related conditions, third trimester: Secondary | ICD-10-CM | POA: Diagnosis not present

## 2017-08-18 DIAGNOSIS — O403XX0 Polyhydramnios, third trimester, not applicable or unspecified: Secondary | ICD-10-CM | POA: Diagnosis not present

## 2017-08-18 DIAGNOSIS — Z3A32 32 weeks gestation of pregnancy: Secondary | ICD-10-CM | POA: Diagnosis not present

## 2017-09-08 ENCOUNTER — Encounter (HOSPITAL_COMMUNITY): Payer: Self-pay

## 2017-09-08 ENCOUNTER — Other Ambulatory Visit (HOSPITAL_COMMUNITY): Payer: Self-pay | Admitting: Maternal and Fetal Medicine

## 2017-09-08 ENCOUNTER — Ambulatory Visit (HOSPITAL_COMMUNITY)
Admission: RE | Admit: 2017-09-08 | Discharge: 2017-09-08 | Disposition: A | Discharge status: Home / Self Care | Payer: 59 | Source: Ambulatory Visit | Attending: Obstetrics & Gynecology | Admitting: Obstetrics & Gynecology

## 2017-09-08 DIAGNOSIS — O358XX0 Maternal care for other (suspected) fetal abnormality and damage, not applicable or unspecified: Secondary | ICD-10-CM

## 2017-09-08 DIAGNOSIS — O09523 Supervision of elderly multigravida, third trimester: Secondary | ICD-10-CM | POA: Diagnosis not present

## 2017-09-08 DIAGNOSIS — Z362 Encounter for other antenatal screening follow-up: Secondary | ICD-10-CM | POA: Diagnosis not present

## 2017-09-08 DIAGNOSIS — Z3A35 35 weeks gestation of pregnancy: Secondary | ICD-10-CM

## 2017-09-08 DIAGNOSIS — N1339 Other hydronephrosis: Secondary | ICD-10-CM

## 2017-09-08 DIAGNOSIS — O35EXX0 Maternal care for other (suspected) fetal abnormality and damage, fetal genitourinary anomalies, not applicable or unspecified: Secondary | ICD-10-CM

## 2017-09-12 DIAGNOSIS — Z3480 Encounter for supervision of other normal pregnancy, unspecified trimester: Secondary | ICD-10-CM | POA: Diagnosis not present

## 2017-09-30 LAB — OB RESULTS CONSOLE GBS: GBS: NEGATIVE

## 2017-10-02 ENCOUNTER — Encounter (HOSPITAL_COMMUNITY): Payer: Self-pay

## 2017-10-03 ENCOUNTER — Inpatient Hospital Stay (HOSPITAL_COMMUNITY)
Admission: AD | Admit: 2017-10-03 | Discharge: 2017-10-03 | Disposition: A | Discharge status: Home / Self Care | Payer: 59 | Source: Ambulatory Visit | Attending: Obstetrics & Gynecology | Admitting: Obstetrics & Gynecology

## 2017-10-03 ENCOUNTER — Encounter (HOSPITAL_COMMUNITY): Payer: Self-pay

## 2017-10-03 ENCOUNTER — Other Ambulatory Visit: Payer: Self-pay

## 2017-10-03 DIAGNOSIS — W010XXA Fall on same level from slipping, tripping and stumbling without subsequent striking against object, initial encounter: Secondary | ICD-10-CM | POA: Diagnosis not present

## 2017-10-03 DIAGNOSIS — Z3A38 38 weeks gestation of pregnancy: Secondary | ICD-10-CM | POA: Diagnosis not present

## 2017-10-03 DIAGNOSIS — W19XXXA Unspecified fall, initial encounter: Secondary | ICD-10-CM

## 2017-10-03 DIAGNOSIS — O26893 Other specified pregnancy related conditions, third trimester: Secondary | ICD-10-CM | POA: Diagnosis not present

## 2017-10-03 DIAGNOSIS — Y92009 Unspecified place in unspecified non-institutional (private) residence as the place of occurrence of the external cause: Secondary | ICD-10-CM

## 2017-10-03 DIAGNOSIS — O479 False labor, unspecified: Secondary | ICD-10-CM

## 2017-10-03 DIAGNOSIS — O471 False labor at or after 37 completed weeks of gestation: Secondary | ICD-10-CM | POA: Diagnosis not present

## 2017-10-03 LAB — FIBRINOGEN: FIBRINOGEN: 518 mg/dL — AB (ref 210–475)

## 2017-10-03 NOTE — MAU Note (Addendum)
G3P2 @ 38.[redacted] wksga. Here dt falling on her steps of the back area of home. States caught fall with landing on knees, no injury to abdominal area. Denies LOF or bleeding. + FM.   1530: EFM applied  1720: monitors placed back on. Pt had gone to bathroom.   VSS see flow sheet for details.

## 2017-10-03 NOTE — Discharge Instructions (Signed)
Braxton Hicks Contractions °Contractions of the uterus can occur throughout pregnancy, but they are not always a sign that you are in labor. You may have practice contractions called Braxton Hicks contractions. These false labor contractions are sometimes confused with true labor. °What are Braxton Hicks contractions? °Braxton Hicks contractions are tightening movements that occur in the muscles of the uterus before labor. Unlike true labor contractions, these contractions do not result in opening (dilation) and thinning of the cervix. Toward the end of pregnancy (32-34 weeks), Braxton Hicks contractions can happen more often and may become stronger. These contractions are sometimes difficult to tell apart from true labor because they can be very uncomfortable. You should not feel embarrassed if you go to the hospital with false labor. °Sometimes, the only way to tell if you are in true labor is for your health care provider to look for changes in the cervix. The health care provider will do a physical exam and may monitor your contractions. If you are not in true labor, the exam should show that your cervix is not dilating and your water has not broken. °If there are other health problems associated with your pregnancy, it is completely safe for you to be sent home with false labor. You may continue to have Braxton Hicks contractions until you go into true labor. °How to tell the difference between true labor and false labor °True labor °· Contractions last 30-70 seconds. °· Contractions become very regular. °· Discomfort is usually felt in the top of the uterus, and it spreads to the lower abdomen and low back. °· Contractions do not go away with walking. °· Contractions usually become more intense and increase in frequency. °· The cervix dilates and gets thinner. °False labor °· Contractions are usually shorter and not as strong as true labor contractions. °· Contractions are usually irregular. °· Contractions  are often felt in the front of the lower abdomen and in the groin. °· Contractions may go away when you walk around or change positions while lying down. °· Contractions get weaker and are shorter-lasting as time goes on. °· The cervix usually does not dilate or become thin. °Follow these instructions at home: °· Take over-the-counter and prescription medicines only as told by your health care provider. °· Keep up with your usual exercises and follow other instructions from your health care provider. °· Eat and drink lightly if you think you are going into labor. °· If Braxton Hicks contractions are making you uncomfortable: °? Change your position from lying down or resting to walking, or change from walking to resting. °? Sit and rest in a tub of warm water. °? Drink enough fluid to keep your urine pale yellow. Dehydration may cause these contractions. °? Do slow and deep breathing several times an hour. °· Keep all follow-up prenatal visits as told by your health care provider. This is important. °Contact a health care provider if: °· You have a fever. °· You have continuous pain in your abdomen. °Get help right away if: °· Your contractions become stronger, more regular, and closer together. °· You have fluid leaking or gushing from your vagina. °· You pass blood-tinged mucus (bloody show). °· You have bleeding from your vagina. °· You have low back pain that you never had before. °· You feel your baby’s head pushing down and causing pelvic pressure. °· Your baby is not moving inside you as much as it used to. °Summary °· Contractions that occur before labor are called Braxton   Hicks contractions, false labor, or practice contractions.  Braxton Hicks contractions are usually shorter, weaker, farther apart, and less regular than true labor contractions. True labor contractions usually become progressively stronger and regular and they become more frequent.  Manage discomfort from [facility] contractions by  changing position, resting in a warm bath, drinking plenty of water, or practicing deep breathing. This information is not intended to replace advice given to you by your health care provider. Make sure you discuss any questions you have with your health care provider. Document Released: 10/27/2016 Document Revised: 10/27/2016 Document Reviewed: 10/27/2016 Elsevier Interactive Patient Education  2018 [place] Prevention in the Home Falls can cause injuries. They can happen to people of all ages. There are many things you can do to make your home safe and to help prevent falls. What can I do on the outside of my home?  Regularly fix the edges of walkways and driveways and fix any cracks.  Remove anything that might make you trip as you walk through a door, such as a raised step or threshold.  Trim any bushes or trees on the path to your home.  Use bright outdoor lighting.  Clear any walking paths of anything that might make someone trip, such as rocks or tools.  Regularly check to see if handrails are loose or broken. Make sure that both sides of any steps have handrails.  Any raised decks and porches should have guardrails on the edges.  Have any leaves, snow, or ice cleared regularly.  Use sand or salt on walking paths during winter.  Clean up any spills in your garage right away. This includes oil or grease spills. What can I do in the bathroom?  Use night lights.  Install grab bars by the toilet and in the tub and shower. Do not use towel bars as grab bars.  Use non-skid mats or decals in the tub or shower.  If you need to sit down in the shower, use a plastic, non-slip stool.  Keep the floor dry. Clean up any water that spills on the floor as soon as it happens.  Remove soap buildup in the tub or shower regularly.  Attach bath mats securely with double-sided non-slip rug tape.  Do not have throw rugs and other things on the floor that can make you  trip. What can I do in the bedroom?  Use night lights.  Make sure that you have a light by your bed that is easy to reach.  Do not use any sheets or blankets that are too big for your bed. They should not hang down onto the floor.  Have a firm chair that has side arms. You can use this for support while you get dressed.  Do not have throw rugs and other things on the floor that can make you trip. What can I do in the kitchen?  Clean up any spills right away.  Avoid walking on wet floors.  Keep items that you use a lot in easy-to-reach places.  If you need to reach something above you, use a strong step stool that has a grab bar.  Keep electrical cords out of the way.  Do not use floor polish or wax that makes floors slippery. If you must use wax, use non-skid floor wax.  Do not have throw rugs and other things on the floor that can make you trip. What can I do with my stairs?  Do not leave any items on  the stairs.  Make sure that there are handrails on both sides of the stairs and use them. Fix handrails that are broken or loose. Make sure that handrails are as long as the stairways.  Check any carpeting to make sure that it is firmly attached to the stairs. Fix any carpet that is loose or worn.  Avoid having throw rugs at the top or bottom of the stairs. If you do have throw rugs, attach them to the floor with carpet tape.  Make sure that you have a light switch at the top of the stairs and the bottom of the stairs. If you do not have them, ask someone to add them for you. What else can I do to help prevent falls?  Wear shoes that: ? Do not have high heels. ? Have rubber bottoms. ? Are comfortable and fit you well. ? Are closed at the toe. Do not wear sandals.  If you use a stepladder: ? Make sure that it is fully opened. Do not climb a closed stepladder. ? Make sure that both sides of the stepladder are locked into place. ? Ask someone to hold it for you, if  possible.  Clearly mark and make sure that you can see: ? Any grab bars or handrails. ? First and last steps. ? Where the edge of each step is.  Use tools that help you move around (mobility aids) if they are needed. These include: ? Canes. ? Walkers. ? Scooters. ? Crutches.  Turn on the lights when you go into a dark area. Replace any light bulbs as soon as they burn out.  Set up your furniture so you have a clear path. Avoid moving your furniture around.  If any of your floors are uneven, fix them.  If there are any pets around you, be aware of where they are.  Review your medicines with your doctor. Some medicines can make you feel dizzy. This can increase your chance of falling. Ask your doctor what other things that you can do to help prevent falls. This information is not intended to replace advice given to you by your health care provider. Make sure you discuss any questions you have with your health care provider. Document Released: 04/09/2009 Document Revised: 11/19/2015 Document Reviewed: 07/18/2014 Elsevier Interactive Patient Education  [employer].

## 2017-10-03 NOTE — MAU Provider Note (Signed)
History     CSN: [id number]  Arrival date and time: 10/03/17 1519   None     Chief Complaint  Patient presents with  . Fall   HPI   Jeanne Lamb is a 37 y.o. female [phone] at [redacted]w[redacted]d here in MAU after a fall. Says around 3 pm today she was carrying in groceries and her sandles got caught on the steps. She fell on her knees and then up against some boxes. She did not hit her abdomen directly, however her abdomen did swiped against the boxes. When she stood up she felt a sharp pain in her lower pelvis; it was cramp like and lasted about 20 seconds. It was intense and then went away and has not come back. + fetal movement.   OB History    Gravida  4   Para  2   Term  2   Preterm      AB  1   Living  2     SAB  1   TAB      Ectopic      Multiple      Live Births  2           Past Medical History:  Diagnosis Date  . Anxiety   . Arthritis    joint pains  . Complication of anesthesia   . 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 Heart murmur   . History of chicken pox   . Insomnia   . PONV (postoperative nausea and vomiting)     Past Surgical History:  Procedure Laterality Date  . bilateral labial reduction    . CESAREAN SECTION      Family History  Problem Relation Age of Onset  . Heart disease Mother        MI  . Hypertension Mother   . COPD Mother   . Cancer Maternal Grandmother        Hodgkins Dz    Social History   Tobacco Use  . Smoking status: Never Smoker  . Smokeless tobacco: Never Used  Substance Use Topics  . Alcohol use: No  . Drug use: No    Allergies:  Allergies  Allergen Reactions  . Minocycline     Medications Prior to Admission  Medication Sig Dispense Refill Last Dose  . Homeopathic Products (HYLAFEM) SUPP Place 1 suppository vaginally daily. (Patient not taking: Reported on 07/25/2016) 3 suppository 0 Not Taking  . Multiple Vitamin (MULTIVITAMIN) tablet Take 1 tablet by mouth daily.   Taking  . Norgestimate-Ethinyl  Estradiol Triphasic (ORTHO TRI-CYCLEN, 28,) 0.18/0.215/0.25 MG-35 MCG tablet Take 1 tablet by mouth daily. (Patient not taking: Reported on 06/16/2017) 1 Package 11 Not Taking  . Omega-3 Fatty Acids (FISH OIL) 1200 MG CAPS Take by mouth.   10/02/2017 at Unknown time  . oseltamivir (TAMIFLU) 75 MG capsule Take 1 capsule (75 mg total) by mouth daily. (Patient not taking: Reported on 09/08/2017) 10 capsule 0 Not Taking  . Prenatal Vit-Fe Fumarate-FA (PRENATAL VITAMIN PO) Take by mouth.   10/02/2017 at Unknown time   Results for orders placed or performed during the hospital encounter of 10/03/17 (from the past 48 hour(s))  Fibrinogen     Status: Abnormal   Collection Time: 10/03/17  5:24 PM  Result Value Ref Range   Fibrinogen 518 (H) 210 - 475 mg/dL    Comment: Performed at [facility], [address], [place], [place] [address]   Review of Systems  Gastrointestinal: Positive for abdominal pain.  Genitourinary: Negative for vaginal bleeding and vaginal discharge.   Physical Exam   Blood pressure (!) 123/56, pulse 95, temperature 98.4 F (36.9 C), temperature source Oral, resp. rate 18, height 5\' 6"  (1.676 m), weight 171 lb (77.6 kg), last menstrual period 01/05/2017, SpO2 98 %.  Physical Exam  Constitutional: She is oriented to person, place, and time. She appears well-developed and well-nourished. No distress.  HENT:  Head: Normocephalic.  Eyes: Pupils are equal, round, and reactive to light.  Neck: Neck supple.  GI: Soft. She exhibits no distension. There is no tenderness. There is no rebound and no guarding.  Genitourinary:  Genitourinary Comments: Cervix:closed  Neurological: She is alert and oriented to person, place, and time.  Skin: Skin is warm. She is not diaphoretic.  Psychiatric: Her behavior is normal.   Fetal Tracing: Baseline: 120 bpm Variability: Moderate  Accelerations: 15x15 Decelerations: None Toco: UI and occasional contractions   MAU Course  Procedures   None  MDM  Fetal monitoring X 4 hours   History of polyhandrimno's and large baby, patient having contractions that she says is no different than what she has been having prior to the fall. Pain is currently 0/10.  Discussed tracing and labs with Dr. Lynnette Caffey, ok for DC home.   Assessment and Plan   A:  1. Fall at home, initial encounter   2. Braxton Hicks contractions     P:  Discharge home in stable condition  Follow up with OB as scheduled Return to MAU if symptoms worsen Return precautions discussed in detail   Rasch, Artist Pais, NP 10/04/2017 2:23 PM

## 2017-10-04 ENCOUNTER — Other Ambulatory Visit: Payer: Self-pay | Admitting: Obstetrics and Gynecology

## 2017-10-08 ENCOUNTER — Encounter (HOSPITAL_COMMUNITY)
Admission: AD | Disposition: A | Discharge status: Home / Self Care | Payer: Self-pay | Source: Ambulatory Visit | Attending: Obstetrics & Gynecology

## 2017-10-08 ENCOUNTER — Inpatient Hospital Stay (HOSPITAL_COMMUNITY)
Admission: AD | Admit: 2017-10-08 | Discharge: 2017-10-11 | DRG: 788 | Disposition: A | Discharge status: Home / Self Care | Payer: 59 | Source: Ambulatory Visit | Attending: Obstetrics & Gynecology | Admitting: Obstetrics & Gynecology

## 2017-10-08 ENCOUNTER — Encounter (HOSPITAL_COMMUNITY): Payer: Self-pay | Admitting: *Deleted

## 2017-10-08 ENCOUNTER — Inpatient Hospital Stay (HOSPITAL_COMMUNITY): Payer: 59 | Admitting: Anesthesiology

## 2017-10-08 ENCOUNTER — Other Ambulatory Visit: Payer: Self-pay

## 2017-10-08 DIAGNOSIS — Z3A39 39 weeks gestation of pregnancy: Secondary | ICD-10-CM

## 2017-10-08 DIAGNOSIS — O3663X0 Maternal care for excessive fetal growth, third trimester, not applicable or unspecified: Secondary | ICD-10-CM | POA: Diagnosis present

## 2017-10-08 DIAGNOSIS — O358XX0 Maternal care for other (suspected) fetal abnormality and damage, not applicable or unspecified: Secondary | ICD-10-CM | POA: Diagnosis present

## 2017-10-08 DIAGNOSIS — Z98891 History of uterine scar from previous surgery: Secondary | ICD-10-CM

## 2017-10-08 DIAGNOSIS — O34211 Maternal care for low transverse scar from previous cesarean delivery: Principal | ICD-10-CM | POA: Diagnosis present

## 2017-10-08 DIAGNOSIS — O4292 Full-term premature rupture of membranes, unspecified as to length of time between rupture and onset of labor: Secondary | ICD-10-CM | POA: Diagnosis present

## 2017-10-08 LAB — CBC
HEMATOCRIT: 36.7 % (ref 36.0–46.0)
Hemoglobin: 12.9 g/dL (ref 12.0–15.0)
MCH: 29 pg (ref 26.0–34.0)
MCHC: 35.1 g/dL (ref 30.0–36.0)
MCV: 82.5 fL (ref 78.0–100.0)
Platelets: 139 10*3/uL — ABNORMAL LOW (ref 150–400)
RBC: 4.45 MIL/uL (ref 3.87–5.11)
RDW: 14.2 % (ref 11.5–15.5)
WBC: 14.5 10*3/uL — ABNORMAL HIGH (ref 4.0–10.5)

## 2017-10-08 LAB — ABO/RH: ABO/RH(D): B POS

## 2017-10-08 LAB — TYPE AND SCREEN
ABO/RH(D): B POS
Antibody Screen: NEGATIVE

## 2017-10-08 LAB — RPR: RPR Ser Ql: NONREACTIVE

## 2017-10-08 LAB — POCT FERN TEST: POCT Fern Test: POSITIVE

## 2017-10-08 LAB — AMNISURE RUPTURE OF MEMBRANE (ROM) NOT AT ARMC: Amnisure ROM: POSITIVE

## 2017-10-08 SURGERY — Surgical Case
Anesthesia: Spinal | Site: Abdomen | Wound class: Clean Contaminated

## 2017-10-08 MED ORDER — TETANUS-DIPHTH-ACELL PERTUSSIS 5-2.5-18.5 LF-MCG/0.5 IM SUSP: 0.5000 mL | Freq: Once | INTRAMUSCULAR | Status: DC

## 2017-10-08 MED ORDER — ONDANSETRON HCL 4 MG/2ML IJ SOLN
INTRAMUSCULAR | Status: AC
  Filled 2017-10-08: qty 2

## 2017-10-08 MED ORDER — PHENYLEPHRINE 40 MCG/ML (10ML) SYRINGE FOR IV PUSH (FOR BLOOD PRESSURE SUPPORT)
PREFILLED_SYRINGE | INTRAVENOUS | Status: AC
  Filled 2017-10-08: qty 10

## 2017-10-08 MED ORDER — DIPHENHYDRAMINE HCL 50 MG/ML IJ SOLN
INTRAMUSCULAR | Status: DC | PRN
  Administered 2017-10-08: 12.5 mg via INTRAVENOUS

## 2017-10-08 MED ORDER — FENTANYL CITRATE (PF) 100 MCG/2ML IJ SOLN
INTRAMUSCULAR | Status: DC | PRN
  Administered 2017-10-08: 25 ug via INTRATHECAL

## 2017-10-08 MED ORDER — KETOROLAC TROMETHAMINE 30 MG/ML IJ SOLN
30.0000 mg | Freq: Four times a day (QID) | INTRAMUSCULAR | Status: AC | PRN
  Administered 2017-10-08: 30 mg via INTRAMUSCULAR

## 2017-10-08 MED ORDER — PHENYLEPHRINE 40 MCG/ML (10ML) SYRINGE FOR IV PUSH (FOR BLOOD PRESSURE SUPPORT)
PREFILLED_SYRINGE | INTRAVENOUS | Status: AC
Start: 2017-10-08 — End: ?
  Filled 2017-10-08: qty 10

## 2017-10-08 MED ORDER — FENTANYL CITRATE (PF) 100 MCG/2ML IJ SOLN
INTRAMUSCULAR | Status: AC
  Filled 2017-10-08: qty 2

## 2017-10-08 MED ORDER — SODIUM CHLORIDE 0.9% FLUSH: 3.0000 mL | INTRAVENOUS | Status: DC | PRN

## 2017-10-08 MED ORDER — MORPHINE SULFATE (PF) 0.5 MG/ML IJ SOLN
INTRAMUSCULAR | Status: AC
  Filled 2017-10-08: qty 10

## 2017-10-08 MED ORDER — NALBUPHINE HCL 10 MG/ML IJ SOLN: 5.0000 mg | INTRAMUSCULAR | Status: DC | PRN

## 2017-10-08 MED ORDER — DIBUCAINE 1 % RE OINT: 1.0000 "application " | TOPICAL_OINTMENT | RECTAL | Status: DC | PRN

## 2017-10-08 MED ORDER — LACTATED RINGERS IV SOLN
INTRAVENOUS | Status: DC
  Administered 2017-10-08: 10:00:00 via INTRAVENOUS

## 2017-10-08 MED ORDER — DIPHENHYDRAMINE HCL 50 MG/ML IJ SOLN: 12.5000 mg | INTRAMUSCULAR | Status: DC | PRN

## 2017-10-08 MED ORDER — PHENYLEPHRINE HCL 10 MG/ML IJ SOLN
INTRAMUSCULAR | Status: DC | PRN
  Administered 2017-10-08: 120 ug via INTRAVENOUS
  Administered 2017-10-08 (×3): 80 ug via INTRAVENOUS
  Administered 2017-10-08: 40 ug via INTRAVENOUS

## 2017-10-08 MED ORDER — PROMETHAZINE HCL 25 MG/ML IJ SOLN
INTRAMUSCULAR | Status: DC | PRN
  Administered 2017-10-08: 6.25 mg via INTRAVENOUS

## 2017-10-08 MED ORDER — STERILE WATER FOR IRRIGATION IR SOLN
Status: DC | PRN
  Administered 2017-10-08: 1000 mL

## 2017-10-08 MED ORDER — FAMOTIDINE IN NACL 20-0.9 MG/50ML-% IV SOLN
20.0000 mg | Freq: Once | INTRAVENOUS | Status: AC
  Administered 2017-10-08: 20 mg via INTRAVENOUS
  Filled 2017-10-08: qty 50

## 2017-10-08 MED ORDER — PRENATAL MULTIVITAMIN CH
1.0000 | ORAL_TABLET | Freq: Every day | ORAL | Status: DC
Start: 2017-10-08 — End: 2017-10-11
  Administered 2017-10-09 – 2017-10-10 (×2): 1 via ORAL
  Filled 2017-10-08 (×2): qty 1

## 2017-10-08 MED ORDER — ONDANSETRON HCL 4 MG/2ML IJ SOLN: 4.0000 mg | Freq: Three times a day (TID) | INTRAMUSCULAR | Status: DC | PRN

## 2017-10-08 MED ORDER — SIMETHICONE 80 MG PO CHEW
80.0000 mg | CHEWABLE_TABLET | Freq: Three times a day (TID) | ORAL | Status: DC
  Administered 2017-10-08 – 2017-10-10 (×5): 80 mg via ORAL
  Filled 2017-10-08 (×7): qty 1

## 2017-10-08 MED ORDER — IBUPROFEN 600 MG PO TABS
600.0000 mg | ORAL_TABLET | Freq: Four times a day (QID) | ORAL | Status: DC
  Administered 2017-10-08 – 2017-10-11 (×11): 600 mg via ORAL
  Filled 2017-10-08 (×11): qty 1

## 2017-10-08 MED ORDER — DIPHENHYDRAMINE HCL 25 MG PO CAPS
25.0000 mg | ORAL_CAPSULE | ORAL | Status: DC | PRN
  Filled 2017-10-08: qty 1

## 2017-10-08 MED ORDER — OXYCODONE HCL 5 MG PO TABS: 10.0000 mg | ORAL_TABLET | ORAL | Status: DC | PRN

## 2017-10-08 MED ORDER — DIPHENHYDRAMINE HCL 50 MG/ML IJ SOLN
INTRAMUSCULAR | Status: AC
  Filled 2017-10-08: qty 1

## 2017-10-08 MED ORDER — SENNOSIDES-DOCUSATE SODIUM 8.6-50 MG PO TABS
2.0000 | ORAL_TABLET | ORAL | Status: DC
  Administered 2017-10-09 – 2017-10-11 (×3): 2 via ORAL
  Filled 2017-10-08 (×3): qty 2

## 2017-10-08 MED ORDER — ACETAMINOPHEN 325 MG PO TABS
650.0000 mg | ORAL_TABLET | ORAL | Status: DC | PRN
  Administered 2017-10-09 – 2017-10-10 (×2): 650 mg via ORAL
  Filled 2017-10-08 (×2): qty 2

## 2017-10-08 MED ORDER — DEXAMETHASONE SODIUM PHOSPHATE 4 MG/ML IJ SOLN
INTRAMUSCULAR | Status: AC
  Filled 2017-10-08: qty 1

## 2017-10-08 MED ORDER — MORPHINE SULFATE (PF) 0.5 MG/ML IJ SOLN
INTRAMUSCULAR | Status: DC | PRN
  Administered 2017-10-08: .2 ug via INTRATHECAL

## 2017-10-08 MED ORDER — BUPIVACAINE IN DEXTROSE 0.75-8.25 % IT SOLN
INTRATHECAL | Status: DC | PRN
  Administered 2017-10-08: 1.6 mL via INTRATHECAL

## 2017-10-08 MED ORDER — SIMETHICONE 80 MG PO CHEW
80.0000 mg | CHEWABLE_TABLET | ORAL | Status: DC
  Administered 2017-10-09 – 2017-10-11 (×3): 80 mg via ORAL
  Filled 2017-10-08 (×3): qty 1

## 2017-10-08 MED ORDER — NALOXONE HCL 0.4 MG/ML IJ SOLN: 0.4000 mg | INTRAMUSCULAR | Status: DC | PRN

## 2017-10-08 MED ORDER — OXYTOCIN 40 UNITS IN LACTATED RINGERS INFUSION - SIMPLE MED: 2.5000 [IU]/h | INTRAVENOUS | Status: AC

## 2017-10-08 MED ORDER — DIPHENHYDRAMINE HCL 25 MG PO CAPS: 25.0000 mg | ORAL_CAPSULE | Freq: Four times a day (QID) | ORAL | Status: DC | PRN

## 2017-10-08 MED ORDER — LIDOCAINE-EPINEPHRINE (PF) 2 %-1:200000 IJ SOLN
INTRAMUSCULAR | Status: AC
  Filled 2017-10-08: qty 20

## 2017-10-08 MED ORDER — ONDANSETRON HCL 4 MG/2ML IJ SOLN
INTRAMUSCULAR | Status: DC | PRN
  Administered 2017-10-08: 4 mg via INTRAVENOUS

## 2017-10-08 MED ORDER — CEFAZOLIN SODIUM-DEXTROSE 2-4 GM/100ML-% IV SOLN
2.0000 g | INTRAVENOUS | Status: AC
  Administered 2017-10-08: 2 g via INTRAVENOUS

## 2017-10-08 MED ORDER — SCOPOLAMINE 1 MG/3DAYS TD PT72
MEDICATED_PATCH | TRANSDERMAL | Status: AC
  Filled 2017-10-08: qty 1

## 2017-10-08 MED ORDER — PHENYLEPHRINE 8 MG IN D5W 100 ML (0.08MG/ML) PREMIX OPTIME
INJECTION | INTRAVENOUS | Status: AC
Start: 2017-10-08 — End: ?
  Filled 2017-10-08: qty 200

## 2017-10-08 MED ORDER — MEPERIDINE HCL 25 MG/ML IJ SOLN: 6.2500 mg | INTRAMUSCULAR | Status: DC | PRN

## 2017-10-08 MED ORDER — SODIUM CHLORIDE 0.9 % IJ SOLN
INTRAMUSCULAR | Status: AC
  Filled 2017-10-08: qty 20

## 2017-10-08 MED ORDER — NALOXONE HCL 4 MG/10ML IJ SOLN
1.0000 ug/kg/h | INTRAVENOUS | Status: DC | PRN
  Filled 2017-10-08: qty 5

## 2017-10-08 MED ORDER — SOD CITRATE-CITRIC ACID 500-334 MG/5ML PO SOLN
30.0000 mL | Freq: Once | ORAL | Status: AC
  Administered 2017-10-08: 30 mL via ORAL
  Filled 2017-10-08: qty 15

## 2017-10-08 MED ORDER — DEXAMETHASONE SODIUM PHOSPHATE 4 MG/ML IJ SOLN
INTRAMUSCULAR | Status: DC | PRN
  Administered 2017-10-08: 4 mg via INTRAVENOUS

## 2017-10-08 MED ORDER — OXYTOCIN 10 UNIT/ML IJ SOLN
INTRAVENOUS | Status: DC | PRN
  Administered 2017-10-08: 40 [IU] via INTRAVENOUS

## 2017-10-08 MED ORDER — KETOROLAC TROMETHAMINE 30 MG/ML IJ SOLN: 30.0000 mg | Freq: Four times a day (QID) | INTRAMUSCULAR | Status: AC | PRN

## 2017-10-08 MED ORDER — PROMETHAZINE HCL 25 MG/ML IJ SOLN
INTRAMUSCULAR | Status: AC
  Filled 2017-10-08: qty 1

## 2017-10-08 MED ORDER — NALBUPHINE HCL 10 MG/ML IJ SOLN: 5.0000 mg | Freq: Once | INTRAMUSCULAR | Status: DC | PRN

## 2017-10-08 MED ORDER — COCONUT OIL OIL: 1.0000 "application " | TOPICAL_OIL | Status: DC | PRN

## 2017-10-08 MED ORDER — LACTATED RINGERS IV SOLN
INTRAVENOUS | Status: DC | PRN
  Administered 2017-10-08 (×3): via INTRAVENOUS

## 2017-10-08 MED ORDER — METOCLOPRAMIDE HCL 5 MG/ML IJ SOLN
INTRAMUSCULAR | Status: DC | PRN
  Administered 2017-10-08 (×2): 5 mg via INTRAVENOUS

## 2017-10-08 MED ORDER — LACTATED RINGERS IV SOLN
INTRAVENOUS | Status: DC
  Administered 2017-10-08 – 2017-10-09 (×2): via INTRAVENOUS

## 2017-10-08 MED ORDER — SCOPOLAMINE 1 MG/3DAYS TD PT72
1.0000 | MEDICATED_PATCH | Freq: Once | TRANSDERMAL | Status: DC
  Filled 2017-10-08: qty 1

## 2017-10-08 MED ORDER — SCOPOLAMINE 1 MG/3DAYS TD PT72
MEDICATED_PATCH | TRANSDERMAL | Status: DC | PRN
  Administered 2017-10-08: 1 via TRANSDERMAL

## 2017-10-08 MED ORDER — WITCH HAZEL-GLYCERIN EX PADS: 1.0000 "application " | MEDICATED_PAD | CUTANEOUS | Status: DC | PRN

## 2017-10-08 MED ORDER — OXYTOCIN 10 UNIT/ML IJ SOLN
INTRAMUSCULAR | Status: AC
  Filled 2017-10-08: qty 4

## 2017-10-08 MED ORDER — PHENYLEPHRINE 8 MG IN D5W 100 ML (0.08MG/ML) PREMIX OPTIME
INJECTION | INTRAVENOUS | Status: DC | PRN
  Administered 2017-10-08: 60 ug/min via INTRAVENOUS

## 2017-10-08 MED ORDER — MENTHOL 3 MG MT LOZG: 1.0000 | LOZENGE | OROMUCOSAL | Status: DC | PRN

## 2017-10-08 MED ORDER — ZOLPIDEM TARTRATE 5 MG PO TABS: 5.0000 mg | ORAL_TABLET | Freq: Every evening | ORAL | Status: DC | PRN

## 2017-10-08 MED ORDER — SIMETHICONE 80 MG PO CHEW: 80.0000 mg | CHEWABLE_TABLET | ORAL | Status: DC | PRN

## 2017-10-08 MED ORDER — OXYCODONE HCL 5 MG PO TABS
5.0000 mg | ORAL_TABLET | ORAL | Status: DC | PRN
  Administered 2017-10-09 – 2017-10-11 (×4): 5 mg via ORAL
  Filled 2017-10-08 (×4): qty 1

## 2017-10-08 MED ORDER — SODIUM CHLORIDE 0.9 % IR SOLN
Status: DC | PRN
  Administered 2017-10-08: 1000 mL

## 2017-10-08 MED ORDER — KETOROLAC TROMETHAMINE 30 MG/ML IJ SOLN
INTRAMUSCULAR | Status: AC
  Filled 2017-10-08: qty 1

## 2017-10-08 SURGICAL SUPPLY — 33 items
APL SKNCLS STERI-STRIP NONHPOA (GAUZE/BANDAGES/DRESSINGS) ×1
BENZOIN TINCTURE PRP APPL 2/3 (GAUZE/BANDAGES/DRESSINGS) ×3 IMPLANT
CHLORAPREP W/TINT 26ML (MISCELLANEOUS) ×3 IMPLANT
CLAMP CORD UMBIL (MISCELLANEOUS) ×2 IMPLANT
CLOSURE WOUND 1/2 X4 (GAUZE/BANDAGES/DRESSINGS) ×1
CLOTH BEACON ORANGE TIMEOUT ST (SAFETY) ×3 IMPLANT
DRSG OPSITE POSTOP 4X10 (GAUZE/BANDAGES/DRESSINGS) ×3 IMPLANT
ELECT REM PT RETURN 9FT ADLT (ELECTROSURGICAL) ×3
ELECTRODE REM PT RTRN 9FT ADLT (ELECTROSURGICAL) ×1 IMPLANT
GLOVE BIO SURGEON STRL SZ 6 (GLOVE) ×3 IMPLANT
GLOVE BIOGEL PI IND STRL 6 (GLOVE) ×2 IMPLANT
GLOVE BIOGEL PI IND STRL 7.0 (GLOVE) ×1 IMPLANT
GLOVE BIOGEL PI INDICATOR 6 (GLOVE) ×4
GLOVE BIOGEL PI INDICATOR 7.0 (GLOVE) ×2
GOWN STRL REUS W/TWL LRG LVL3 (GOWN DISPOSABLE) ×6 IMPLANT
KIT ABG SYR 3ML LUER SLIP (SYRINGE) ×3 IMPLANT
NDL HYPO 25X5/8 SAFETYGLIDE (NEEDLE) ×1 IMPLANT
NEEDLE HYPO 25X5/8 SAFETYGLIDE (NEEDLE) ×3 IMPLANT
NS IRRIG 1000ML POUR BTL (IV SOLUTION) ×3 IMPLANT
PACK C SECTION WH (CUSTOM PROCEDURE TRAY) ×3 IMPLANT
PAD ABD 7.5X8 STRL (GAUZE/BANDAGES/DRESSINGS) ×2 IMPLANT
PAD ABD 8X7 1/2 STERILE (GAUZE/BANDAGES/DRESSINGS) ×2 IMPLANT
PAD OB MATERNITY 4.3X12.25 (PERSONAL CARE ITEMS) ×3 IMPLANT
PENCIL SMOKE EVAC W/HOLSTER (ELECTROSURGICAL) ×3 IMPLANT
SPONGE GAUZE 4X4 12PLY STER LF (GAUZE/BANDAGES/DRESSINGS) ×4 IMPLANT
STRIP CLOSURE SKIN 1/2X4 (GAUZE/BANDAGES/DRESSINGS) ×1 IMPLANT
SUT CHROMIC 0 CTX 36 (SUTURE) ×9 IMPLANT
SUT MON AB 2-0 CT1 27 (SUTURE) ×3 IMPLANT
SUT VIC AB 0 CT1 36 (SUTURE) ×2 IMPLANT
SUT VIC AB 4-0 PS2 27 (SUTURE) ×2 IMPLANT
TAPE CLOTH SURG 4X10 WHT LF (GAUZE/BANDAGES/DRESSINGS) ×2 IMPLANT
TOWEL OR 17X24 6PK STRL BLUE (TOWEL DISPOSABLE) ×3 IMPLANT
TRAY FOLEY W/BAG SLVR 14FR LF (SET/KITS/TRAYS/PACK) ×2 IMPLANT

## 2017-10-08 NOTE — MAU Note (Signed)
Urine in lab 

## 2017-10-08 NOTE — Brief Op Note (Signed)
10/08/2017  10:55 AM  PATIENT:  Jeanne Lamb  37 y.o. female  PRE-OPERATIVE DIAGNOSIS:  previous X 2, fetal hydroneprhosis, macrosomia  POST-OPERATIVE DIAGNOSIS:  previous X 2, fetal hydroneprhosis, macrosomia  PROCEDURE:  Procedure(s) with comments: REPEAT CESAREAN SECTION (N/A) - Repeat edc 10/12/17 allergy to solodyn need RNFA  SURGEON:  Surgeon(s) and Role:    * Faizon Capozzi, Colin Benton, MD - Primary  PHYSICIAN ASSISTANT:   ASSISTANTS: Melida Gimenez, RNFA   ANESTHESIA:   spinal  EBL:  682 mL   BLOOD ADMINISTERED:none  DRAINS: Urinary Catheter (Foley)   LOCAL MEDICATIONS USED:  NONE  SPECIMEN:  Source of Specimen:  placenta  DISPOSITION OF SPECIMEN:  routine - L&D  COUNTS:  YES  TOURNIQUET:  * No tourniquets in log *  DICTATION: .Note written in EPIC  PLAN OF CARE: Admit to inpatient   PATIENT DISPOSITION:  PACU - hemodynamically stable.   Delay start of Pharmacological VTE agent (>24hrs) due to surgical blood loss or risk of bleeding: not applicable

## 2017-10-08 NOTE — MAU Note (Signed)
OR coordinator, anes, NICU, AC and central nursery notified

## 2017-10-08 NOTE — Op Note (Signed)
PROCEDURE DATE: 10/08/17  PREOPERATIVE DIAGNOSIS: Intrauterine pregnancy at 39.3 wga, Indication: s/p PROM, for rCS  POSTOPERATIVE DIAGNOSIS:The same  PROCEDURE: Repeat Low TransverseCesarean Section  SURGEON: Dr. Lucillie Garfinkel  INDICATIONS:This is a 25QD [id number] at 39.3 wga requiring cesarean section secondary to PROM, desiring repeat.The risks of cesarean section discussed with the patient included but were not limited to: bleeding which may require transfusion or reoperation; infection which may require antibiotics; injury to bowel, bladder, ureters or other surrounding organs; injury to the fetus; need for additional procedures including hysterectomy in the event of a life-threatening hemorrhage; placental abnormalities wth subsequent pregnancies, incisional problems, thromboembolic phenomenon and other postoperative/anesthesia complications. The patient agreed with the proposed plan, giving informed consent for the procedure. Declined BTL.   FINDINGS: Viable maleinfant in vertex presentation,APGARs pending, Weight pending, Amniotic fluid clear, Intact placenta, three vessel cord. Grossly normal uterus, ovaries and fallopian tubes. .  ANESTHESIA: Epidural ESTIMATED BLOOD LOSS: 682cc SPECIMENS: Placenta for L&D COMPLICATIONS: None immediate  PROCEDURE IN DETAIL: The patient received intravenous antibiotics (2g Ancef) and had sequential compression devices applied to her lower extremities while in the preoperative area. Shewasthen taken to the operating roomwhere epidural anesthesiawas dosed up to surgical level andwas found to be adequate. She was then placed in a dorsal supine position with a leftward tilt,and prepped and draped in a sterile manner.A foley catheter was placed into her bladder and attached to constant gravity. After an adequate timeout was performed, aPfannenstiel skin incision was made with scalpel and carried through to the underlying  layer of fascia. The fascia was incised in the midline and this incision was extended bilaterally using the Mayo scissors. Kocher clamps were applied to the superior aspect of the fascial incision and the underlying rectus muscles were dissected off bluntly. A similar process was carried out on the inferior aspect of the facial incision. The rectus muscles were separated in the midline bluntly and the peritoneum was entered bluntly. A bladder flap was created sharply and developed bluntly.Atransverse hysterotomy was made with a scalpel and extended bilaterally bluntly. The bladder blade was then removed. The infant was successfully delivered, and cord was clamped and cut and infant was handed over to awaiting neonatology team. Uterine massage was then administered and the placenta delivered intact with three-vessel cord. Cord gases were taken. The uterus was cleared of clot and debris. The hysterotomy was closed with 0 vicryl.A second imbricating suture of 0-vicryl was used to reinforce the incision and aid in hemostasis.The fascia was closed with 0-Vicryl in a running fashion with good restoration of anatomy. The subcutaneus tissue was irrigated and was reapproximated using three interrupted plain gut stitches. The skin was closed with 4-0 Vicryl in a subcuticular fashion.  Final EBL was 682cc (all surgical site and was hemostatic at end of procedure) without any further bleeding on exam.   Pt tolerated the procedure well. All sponge/lap/needle counts were correct X 2. Pt taken to recovery room in stable condition.   It's a boy - "Herschel [place]" - to join two brother at home! Peds aware of his renal collecting system abnl and have assumed his care. He was was also noted to possibly have R sacral dimple.    Lucillie Garfinkel MD

## 2017-10-08 NOTE — H&P (Signed)
Jeanne Lamb is a 37 y.o. 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 Hx two prior CS - both infants >9 pounds.    OB History    Gravida  4   Para  2   Term  2   Preterm      AB  1   Living  2     SAB  1   TAB      Ectopic      Multiple      Live Births  2          Past Medical History:  Diagnosis Date  . Anxiety   . Arthritis    joint pains  . Complication of anesthesia   . 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 Heart murmur   . History of chicken pox   . Insomnia   . PONV (postoperative nausea and vomiting)    Past Surgical History:  Procedure Laterality Date  . bilateral labial reduction    . CESAREAN SECTION     Family History: family history includes COPD in her mother; Cancer in her maternal grandmother; Heart disease in her mother; Hypertension in her mother. Social History:  reports that she has never smoked. She has never used smokeless tobacco. She reports that she does not drink alcohol or use drugs.     Maternal Diabetes: No Genetic Screening: Normal Maternal Ultrasounds/Referrals: Normal Fetal Ultrasounds or other Referrals:  Other:  :eft UTD, right UPJ Maternal Substance Abuse:  No Significant Maternal Medications:  None Significant Maternal Lab Results:  None Other Comments:  None  ROS History Dilation: 2 Effacement (%): 80 Station: -2 Exam by:: n druebbisch rn Blood pressure 118/71, pulse 94, temperature 98.3 F (36.8 C), resp. rate 18, height 5\' 5"  (1.651 m), weight 76.2 kg (168 lb), last menstrual period 01/05/2017, SpO2 98 %. Exam Physical Exam  NAD, A&O NWOB Abd soft, nondistended, gravid SVE above, ferning +, amniosure +  Prenatal labs: ABO, Rh: B/Positive/-- (09/20 0000) Antibody: Negative (09/20 0000) Rubella: Immune (09/20 0000) RPR: Nonreactive (09/20 0000)  HBsAg: Negative (09/20 0000)  HIV: Non-reactive (09/20 0000)  GBS: Negative (04/06 0000)   Assessment/Plan: 37 yo  [id number] @ 36.3 wga presenting s/p PROM, planning for rCS. Pregnancy c/b fetal renal system anomaly with plan for peds urology to follow w/imaging + abx. Risks of CS discussed including infection, bleeding, damage to surrounding structures, need for additional procedures, possibility of scar tissue/placental abnl given prior CSx2, and hysterectomy. Also discussed possibility of blood transfusion.  All questions answered. Consent signed. Proceed with above surgery. 2g Ancef on call to OR.   Jeanne Garfinkel MD    Colin Benton Leger 10/08/2017, 8:42 AM

## 2017-10-08 NOTE — Transfer of Care (Signed)
Immediate Anesthesia Transfer of Care Note  Patient: Jeanne Lamb  Procedure(s) Performed: REPEAT CESAREAN SECTION (N/A Abdomen)  Patient Location: PACU  Anesthesia Type:Spinal  Level of Consciousness: awake, alert  and oriented  Airway & Oxygen Therapy: Patient Spontanous Breathing  Post-op Assessment: Report given to RN and Post -op Vital signs reviewed and stable, with patient weaning off phenylephrine infusion ( used for spinal block) in PACU.  Post vital signs: Reviewed and stable  Last Vitals:  Vitals Value Taken Time  BP 94/52 10/08/2017 11:07 AM  Temp    Pulse 59 10/08/2017 11:10 AM  Resp 21 10/08/2017 11:10 AM  SpO2 97 % 10/08/2017 11:10 AM  Vitals shown include unvalidated device data.  Last Pain:  Vitals:   10/08/17 0700  PainSc: 4          Complications: No apparent anesthesia complications

## 2017-10-08 NOTE — MAU Note (Signed)
Pt reports LOF since 0430. Scheduled for c/s on Wednesday

## 2017-10-08 NOTE — Anesthesia Procedure Notes (Signed)
Spinal  Patient location during procedure: OR Start time: 10/08/2017 9:30 AM End time: 10/08/2017 9:35 AM Staffing Anesthesiologist: Janeece Riggers, MD Preanesthetic Checklist Completed: patient identified, site marked, surgical consent, pre-op evaluation, timeout performed, IV checked, risks and benefits discussed and monitors and equipment checked Spinal Block Patient position: sitting Prep: DuraPrep Patient monitoring: heart rate, cardiac monitor, continuous pulse ox and blood pressure Approach: midline Location: L4-5 Injection technique: single-shot Needle Needle type: Sprotte  Needle gauge: 24 G Needle length: 9 cm Assessment Sensory level: T4

## 2017-10-08 NOTE — Anesthesia Preprocedure Evaluation (Addendum)
Anesthesia Evaluation  Patient identified by MRN, date of birth, ID band Patient awake    Reviewed: Allergy & Precautions, NPO status , Patient's Chart, lab work & pertinent test results  History of Anesthesia Complications (+) PONV and history of anesthetic complications  Airway Mallampati: II  TM Distance: >3 FB Neck ROM: Full    Dental no notable dental hx.    Pulmonary neg pulmonary ROS,    Pulmonary exam normal breath sounds clear to auscultation       Cardiovascular negative cardio ROS Normal cardiovascular exam+ Valvular Problems/Murmurs  Rhythm:Regular Rate:Normal     Neuro/Psych PSYCHIATRIC DISORDERS Anxiety Depression negative neurological ROS  negative psych ROS   GI/Hepatic negative GI ROS, Neg liver ROS,   Endo/Other  negative endocrine ROS  Renal/GU negative Renal ROS  negative genitourinary   Musculoskeletal negative musculoskeletal ROS (+)   Abdominal   Peds negative pediatric ROS (+)  Hematology negative hematology ROS (+)   Anesthesia Other Findings   Reproductive/Obstetrics negative OB ROS                             Anesthesia Physical Anesthesia Plan  ASA: II  Anesthesia Plan: Spinal   Post-op Pain Management:    Induction:   PONV Risk Score and Plan: 3 and Treatment may vary due to age or medical condition and Scopolamine patch - Pre-op  Airway Management Planned:   Additional Equipment:   Intra-op Plan:   Post-operative Plan:   Informed Consent:   Plan Discussed with: CRNA, Anesthesiologist and Surgeon  Anesthesia Plan Comments: (  )        Anesthesia Quick Evaluation

## 2017-10-08 NOTE — Anesthesia Postprocedure Evaluation (Signed)
Anesthesia Post Note  Patient: Jeanne Lamb  Procedure(s) Performed: REPEAT CESAREAN SECTION (N/A Abdomen)     Patient location during evaluation: PACU Anesthesia Type: Spinal Level of consciousness: oriented and awake and alert Pain management: pain level controlled Vital Signs Assessment: post-procedure vital signs reviewed and stable Respiratory status: spontaneous breathing, respiratory function stable and patient connected to nasal cannula oxygen Cardiovascular status: blood pressure returned to baseline and stable Postop Assessment: no headache, no backache and no apparent nausea or vomiting Anesthetic complications: no    Last Vitals:  Vitals:   10/08/17 1245 10/08/17 1407  BP: (!) 103/58 111/68  Pulse: 65 (!) 58  Resp: 16 16  Temp: 36.8 C 36.5 C  SpO2: 100% 100%    Last Pain:  Vitals:   10/08/17 1407  TempSrc: Oral  PainSc:    Pain Goal:                 [name]

## 2017-10-08 NOTE — Progress Notes (Signed)
Pt being transported to OR. Sodium citrate given, betadine nose swabs performed, and CHG bath performed. Consent form signed. Fetal heart rate around 135 at time of transport.

## 2017-10-09 ENCOUNTER — Encounter (HOSPITAL_COMMUNITY): Payer: Self-pay | Admitting: *Deleted

## 2017-10-09 LAB — CBC
HCT: 27.3 % — ABNORMAL LOW (ref 36.0–46.0)
Hemoglobin: 9.5 g/dL — ABNORMAL LOW (ref 12.0–15.0)
MCH: 29.1 pg (ref 26.0–34.0)
MCHC: 34.8 g/dL (ref 30.0–36.0)
MCV: 83.7 fL (ref 78.0–100.0)
Platelets: 121 K/uL — ABNORMAL LOW (ref 150–400)
RBC: 3.26 MIL/uL — ABNORMAL LOW (ref 3.87–5.11)
RDW: 14.3 % (ref 11.5–15.5)
WBC: 13.4 K/uL — ABNORMAL HIGH (ref 4.0–10.5)

## 2017-10-09 LAB — BIRTH TISSUE RECOVERY COLLECTION (PLACENTA DONATION)

## 2017-10-09 NOTE — Progress Notes (Addendum)
Subjective: Postpartum Day 1: Cesarean Delivery Patient reports tolerating PO and no problems voiding.  Patient desires circ but given urologic concerns, will await ultrasound results tomorrow.  Objective: Vital signs in last 24 hours: Temp:  [97.6 F (36.4 C)-98.6 F (37 C)] 98 F (36.7 C) (04/15 0935) Pulse Rate:  [49-78] 63 (04/15 0935) Resp:  [13-27] 18 (04/15 0935) BP: (81-111)/(49-77) 104/56 (04/15 0935) SpO2:  [96 %-100 %] 99 % (04/15 0935)  Physical Exam:  General: alert, cooperative and appears stated age Lochia: appropriate Uterine Fundus: firm Incision: healing well, no significant drainage, no dehiscence DVT Evaluation: No evidence of DVT seen on physical exam. Negative Homan's sign. No cords or calf tenderness.  Recent Labs    10/08/17 0850 10/09/17 0532  HGB 12.9 9.5*  HCT 36.7 27.3*    Assessment/Plan: Status post Cesarean section. Doing well postoperatively.  Continue current care.  Jeanne Lamb, Eleva 10/09/2017, 9:42 AM

## 2017-10-09 NOTE — Lactation Note (Signed)
This note was copied from a baby's chart. Lactation Consultation Note  Patient Name: Jeanne Lamb Today's Date: 10/09/2017 Reason for consult: Initial assessment;Infant weight loss;Term This is mom's third baby and she breastfed her previous babies for over 1 year.  Mom feels baby is feeding well.  Baby is at 1 hours old and he has lost 8% of his weight.  He has had 7 voids and 4 stools which is probably the reason for the loss.  Pediatrician wants mom to supplement with formula every third feeding.  Mom really does not want to use formula.  Symphony pump set up and initiated.  Mom has good flow of milk and obtained 10-20 mls in first five minutes.  Mom will supplement with breastmilk per bottle by choice.  Instructed to breastfeed with cues using good breast massage and waking techniques.  Post pump every 3 hours and give expressed milk back to baby.  Maternal Data Has patient been taught Hand Expression?: Yes Does the patient have breastfeeding experience prior to this delivery?: Yes  Feeding    LATCH Score                   Interventions    Lactation Tools Discussed/Used Pump Review: Setup, frequency, and cleaning;Milk Storage Initiated by:: LM Date initiated:: 10/09/17   Consult Status Consult Status: Follow-up Date: 10/10/17 Follow-up type: In-patient    Ave Filter 10/09/2017, 9:03 AM

## 2017-10-09 NOTE — Anesthesia Postprocedure Evaluation (Signed)
Anesthesia Post Note  Patient: Jeanne Lamb  Procedure(s) Performed: REPEAT CESAREAN SECTION (N/A Abdomen)     Patient location during evaluation: Mother Baby Anesthesia Type: Spinal Level of consciousness: awake and alert Pain management: pain level controlled Vital Signs Assessment: post-procedure vital signs reviewed and stable Respiratory status: spontaneous breathing, nonlabored ventilation and respiratory function stable Cardiovascular status: stable Postop Assessment: no headache, no backache, spinal receding, no apparent nausea or vomiting, patient able to bend at knees and adequate PO intake Anesthetic complications: no    Last Vitals:  Vitals:   10/09/17 0257 10/09/17 0508  BP:  (!) 95/59  Pulse:  (!) 57  Resp:  18  Temp:  36.8 C  SpO2: 99% 99%    Last Pain:  Vitals:   10/09/17 0508  TempSrc: Oral  PainSc: 1    Pain Goal: Patients Stated Pain Goal: 3 (10/08/17 1745)               Jabier Mutton

## 2017-10-09 NOTE — Addendum Note (Signed)
Addendum  created 10/09/17 0851 by Hewitt Blade, CRNA   Sign clinical note

## 2017-10-10 ENCOUNTER — Encounter (HOSPITAL_COMMUNITY)
Admission: RE | Admit: 2017-10-10 | Discharge: 2017-10-10 | Disposition: A | Discharge status: Home / Self Care | Payer: 59 | Source: Ambulatory Visit | Attending: Obstetrics & Gynecology | Admitting: Obstetrics & Gynecology

## 2017-10-10 HISTORY — DX: Nausea with vomiting, unspecified: R11.2

## 2017-10-10 HISTORY — DX: Other specified postprocedural states: Z98.890

## 2017-10-10 HISTORY — DX: Adverse effect of unspecified anesthetic, initial encounter: T41.45XA

## 2017-10-10 HISTORY — DX: Other complications of anesthesia, initial encounter: T88.59XA

## 2017-10-10 NOTE — Progress Notes (Signed)
Patient doing well.  No complaints.  BP (!) 114/56 (BP Location: Left Arm)   Pulse 73   Temp 98.2 F (36.8 C) (Oral)   Resp 18   Ht 5\' 5"  (1.651 m)   Wt 76.2 kg (168 lb)   LMP 01/05/2017   SpO2 99%   Breastfeeding? Unknown   BMI 27.96 kg/m  No results found for this or any previous visit (from the past 24 hour(s)). Abdomen is soft and non tender Incision is clean dry and intact  POD # 2  Doing well Baby waiting to have renal scan Discharge tomorrow

## 2017-10-10 NOTE — Lactation Note (Signed)
This note was copied from a baby's chart. Lactation Consultation Note  Patient Name: Jeanne Lamb Today's Date: 10/10/2017  Weight is stable.  Baby is 49 hours old. Mom reports he is feeding well with many audible swallows. Cluster feeding.  She states breasts are becoming heavy.  Reviewed engorgement prevention and treatment.  Instructed to call with concerns/assist prn.   Maternal Data    Feeding Feeding Type: Breast Milk Length of feed: 15 min  LATCH Score                   Interventions    Lactation Tools Discussed/Used     Consult Status      Ave Filter 10/10/2017, 2:21 PM

## 2017-10-10 NOTE — Progress Notes (Signed)
MOB was referred for history of depression/anxiety. * Referral screened out by Clinical Social Worker because none of the following criteria appear to apply: ~ History of anxiety/depression during this pregnancy, or of post-partum depression. ~ Diagnosis of anxiety and/or depression within last 3 years OR * MOB's symptoms currently being treated with medication and/or therapy. Please contact the Clinical Social Worker if needs arise or by MOB request.  Laurey Arrow, MSW, LCSW Clinical Social Work [phone]

## 2017-10-11 ENCOUNTER — Inpatient Hospital Stay (HOSPITAL_COMMUNITY): Admission: RE | Admit: 2017-10-11 | Payer: 59 | Source: Ambulatory Visit | Admitting: Obstetrics & Gynecology

## 2017-10-11 MED ORDER — IBUPROFEN 600 MG PO TABS: 600.0000 mg | ORAL_TABLET | Freq: Four times a day (QID) | ORAL | 0 refills | Status: DC

## 2017-10-11 MED ORDER — OXYCODONE HCL 5 MG PO TABS: 5.0000 mg | ORAL_TABLET | ORAL | 0 refills | Status: DC | PRN

## 2017-10-11 MED FILL — oxyCODONE HCL 5 MG TABS: 5 | 2 days supply | Qty: 15 | Fill #0

## 2017-10-11 MED FILL — IBUPROFEN 600 MG TABLET: 600 | 7 days supply | Qty: 30 | Fill #0

## 2017-10-11 NOTE — Discharge Summary (Signed)
Obstetric Discharge Summary Reason for Admission: rupture of membranes Prenatal Procedures: none Intrapartum Procedures: cesarean: low cervical, transverse - repeat CS Postpartum Procedures: none Complications-Operative and Postpartum: none Hemoglobin  Date Value Ref Range Status  10/09/2017 9.5 (L) 12.0 - 15.0 g/dL Final    Comment:    DELTA CHECK NOTED REPEATED TO VERIFY    HCT  Date Value Ref Range Status  10/09/2017 27.3 (L) 36.0 - 46.0 % Final    Physical Exam:  General: alert, cooperative and appears stated age 37: appropriate Uterine Fundus: firm Incision: healing well, no significant drainage, no dehiscence, no significant erythema DVT Evaluation: No evidence of DVT seen on physical exam. Negative Homan's sign. No cords or calf tenderness. No significant calf/ankle edema.  Discharge Diagnoses: Term Pregnancy-delivered  Discharge Information: Date: 10/11/2017 Activity: pelvic rest Diet: routine Medications: Ibuprofen and Percocet Condition: stable Instructions: refer to practice specific booklet Discharge to: home   Newborn Data: Live born female  Birth Weight: 9 lb 6.8 oz (4275 g) APGAR: 9, 9 Baby's kidney studies still pending, plan for outpatient circ  Newborn Delivery   Birth date/time:  10/08/2017 10:05:00 Delivery type:  C-Section, Low Transverse Trial of labor:  No C-section categorization:  Repeat     Home with mother.  Jeanne Lamb 10/11/2017, 8:48 AM

## 2017-10-11 NOTE — Lactation Note (Signed)
This note was copied from a baby's chart. Lactation Consultation Note  Patient Name: Boy Analeigha Lightle Today's Date: 10/11/2017 Reason for consult: Follow-up assessment   P3, Baby 38 hours old and changed yellow seedy diaper. Baby latched easily.  Sucks and swallows observed. Mom encouraged to feed baby 8-12 times/24 hours and with feeding cues.  Reviewed engorgement care and monitoring voids/stools.      Maternal Data    Feeding Feeding Type: Breast Fed  LATCH Score Latch: Grasps breast easily, tongue down, lips flanged, rhythmical sucking.  Audible Swallowing: Spontaneous and intermittent  Type of Nipple: Everted at rest and after stimulation  Comfort (Breast/Nipple): Soft / non-tender  Hold (Positioning): No assistance needed to correctly position infant at breast.  LATCH Score: 10  Interventions    Lactation Tools Discussed/Used     Consult Status Consult Status: Follow-up Date: 10/12/17 Follow-up type: In-patient    Vivianne Master [facility] 10/11/2017, 9:49 AM

## 2017-11-23 DIAGNOSIS — Z3480 Encounter for supervision of other normal pregnancy, unspecified trimester: Secondary | ICD-10-CM | POA: Diagnosis not present

## 2017-11-23 DIAGNOSIS — Z1389 Encounter for screening for other disorder: Secondary | ICD-10-CM | POA: Diagnosis not present

## 2017-12-21 DIAGNOSIS — L811 Chloasma: Secondary | ICD-10-CM | POA: Diagnosis not present

## 2017-12-21 DIAGNOSIS — L700 Acne vulgaris: Secondary | ICD-10-CM | POA: Diagnosis not present

## 2017-12-21 MED FILL — TAZAROTENE 0.1 % CREA: 0.1 | 20 days supply | Qty: 30 | Fill #0

## 2018-01-09 DIAGNOSIS — R87810 Cervical high risk human papillomavirus (HPV) DNA test positive: Secondary | ICD-10-CM | POA: Diagnosis not present

## 2018-01-09 DIAGNOSIS — N888 Other specified noninflammatory disorders of cervix uteri: Secondary | ICD-10-CM | POA: Diagnosis not present

## 2018-01-09 DIAGNOSIS — R87610 Atypical squamous cells of undetermined significance on cytologic smear of cervix (ASC-US): Secondary | ICD-10-CM | POA: Diagnosis not present

## 2018-01-09 MED FILL — SERTRALINE HCL 25 MG TABLET: 25 | 30 days supply | Qty: 60 | Fill #0

## 2018-02-27 MED FILL — SERTRALINE HCL 25 MG TABLET: 25 | 30 days supply | Qty: 60 | Fill #1

## 2018-07-16 ENCOUNTER — Ambulatory Visit (INDEPENDENT_AMBULATORY_CARE_PROVIDER_SITE_OTHER): Payer: Self-pay | Admitting: Physician Assistant

## 2018-07-16 VITALS — BP 110/72 | HR 82 | Temp 98.1°F | Resp 14 | Wt 149.0 lb

## 2018-07-16 DIAGNOSIS — Z20828 Contact with and (suspected) exposure to other viral communicable diseases: Secondary | ICD-10-CM

## 2018-07-16 DIAGNOSIS — J029 Acute pharyngitis, unspecified: Secondary | ICD-10-CM

## 2018-07-16 LAB — POCT RAPID STREP A (OFFICE): Rapid Strep A Screen: NEGATIVE

## 2018-07-16 LAB — POCT INFLUENZA A/B
Influenza A, POC: NEGATIVE
Influenza B, POC: NEGATIVE

## 2018-07-16 MED ORDER — OSELTAMIVIR PHOSPHATE 75 MG PO CAPS: 75.0000 mg | ORAL_CAPSULE | Freq: Every day | ORAL | 0 refills | Status: AC

## 2018-07-16 NOTE — Progress Notes (Signed)
MRN: [id number] DOB: December 12, 1980  Subjective:   Jeanne Lamb is a 38 y.o. female presenting for chief complaint of Sore Throat . She works in [employer] and has 3 boys at home (ages 14 mo, 37yo, and 37yo). The 13 month old was just tx for double ear infection and strep throat. The 37 yo was just dx with flu 2 days ago and given tamiflu. Her 84 mo old and 37yo are on prophylaxis. She woke up this morning and had a scratchy throat and feels overall tired but thinks it is due to her being exhausted for caring for all 3 boys by herself.Has not gotten much rest since they have been sick. Denies fever, chills, body aches, sinus congestion , sinus pain, rhinorrhea, difficulty swallowing, pain with swallowing, inability to swallow, voice change, dry cough, productive cough, wheezing, shortness of breath, chest tightness and chest pain, nausea, vomiting, abdominal pain and diarrhea. No history of seasonal allergies, asthma, COPD, autoimmune disorder, DM, or HTN.  Patient has had flu shot this season. Denies smoking. She is currently breastfeeding.  Denies any other aggravating or relieving factors, no other questions or concerns.  Review of Systems  Respiratory: Negative for hemoptysis.   Cardiovascular: Negative for palpitations.  Musculoskeletal: Negative for joint pain and neck pain.  Skin: Negative for rash.  Neurological: Negative for dizziness and focal weakness.    Takeesha has a current medication list which includes the following prescription(s): acetaminophen, docusate sodium, ibuprofen, oseltamivir, oxycodone, oxycodone, and prenatal multivitamin. Also is allergic to minocycline.  Cherese  has a past medical history of Anxiety, Arthritis, Complication of anesthesia, Depression, Heart murmur, History of chicken pox, Insomnia, and PONV (postoperative nausea and vomiting). Also  has a past surgical history that includes Cesarean section; bilateral labial reduction; and Cesarean section (N/A,  10/08/2017).   Objective:   Vitals: BP 110/72   Pulse 82   Temp 98.1 F (36.7 C)   Resp 14   Wt 149 lb (67.6 kg)   SpO2 98%   BMI 24.79 kg/m   Physical Exam Vitals signs reviewed.  Constitutional:      General: She is not in acute distress.    Appearance: She is well-developed. She is not ill-appearing.  HENT:     Head: Normocephalic and atraumatic.     Right Ear: Tympanic membrane, ear canal and external ear normal.     Left Ear: Tympanic membrane, ear canal and external ear normal.     Nose: Nose normal.     Right Sinus: No maxillary sinus tenderness or frontal sinus tenderness.     Left Sinus: No maxillary sinus tenderness or frontal sinus tenderness.     Mouth/Throat:     Lips: Pink.     Mouth: Mucous membranes are moist.     Pharynx: Uvula midline. Posterior oropharyngeal erythema (mild erythema noted) present. No oropharyngeal exudate or uvula swelling.     Tonsils: No tonsillar exudate or tonsillar abscesses. Swelling: 1+ on the right. 1+ on the left.  Eyes:     Conjunctiva/sclera: Conjunctivae normal.  Neck:     Musculoskeletal: Normal range of motion.  Cardiovascular:     Rate and Rhythm: Normal rate and regular rhythm.     Heart sounds: Normal heart sounds.  Pulmonary:     Effort: Pulmonary effort is normal.     Breath sounds: Normal breath sounds. No decreased breath sounds, wheezing, rhonchi or rales.  Lymphadenopathy:     Head:     Right side of head:  No submental, submandibular, tonsillar, preauricular, posterior auricular or occipital adenopathy.     Left side of head: No submental, submandibular, tonsillar, preauricular, posterior auricular or occipital adenopathy.     Cervical: No cervical adenopathy.     Upper Body:     Right upper body: No supraclavicular adenopathy.     Left upper body: No supraclavicular adenopathy.  Skin:    General: Skin is warm and dry.  Neurological:     Mental Status: She is alert.     Results for orders placed or  performed in visit on 07/16/18 (from the past 24 hour(s))  POCT rapid strep A     Status: None   Collection Time: 07/16/18  4:47 PM  Result Value Ref Range   Rapid Strep A Screen Negative Negative  POCT Influenza A/B     Status: None   Collection Time: 07/16/18  4:48 PM  Result Value Ref Range   Influenza A, POC Negative Negative   Influenza B, POC Negative Negative    Assessment and Plan :  1. Exposure to the flu Pt is overall well appearing, NAD. VSS. POC strep and flu negative, pt reassured. Mild erythema of posterior oropharynx but otherwise normal throat exam. Suspect fatigue is coming from lack of sleep over the past week caring for 3 boys (2 of which are sick). Will provide pt with tamiflu prophylaxis due to her recent exposure, children's ages, and job. Recommend she rest, continue hydrating, eat light meals. May use throat lozenges for scratchy throat. Encouraged her to f/u with family doctor if symptoms worsen/develop new concerning sx with current tx plan.   - oseltamivir (TAMIFLU) 75 MG capsule; Take 1 capsule (75 mg total) by mouth daily for 10 days.  Dispense: 10 capsule; Refill: 0  2. Sore throat - POCT rapid strep A - POCT Influenza A/B  Tenna Delaine, PA-C  [facility] 07/16/2018 5:01 PM

## 2018-07-16 NOTE — Patient Instructions (Signed)
Start tamiflu as prescribed for prophylaxis. Make sure you rest, increase fluids, and eat light meals. Wash hands and wear mask around children. If you start to have flu like symptoms, please follow up with family doctor.

## 2018-07-19 ENCOUNTER — Telehealth: Payer: Self-pay | Admitting: Emergency Medicine

## 2018-07-19 NOTE — Telephone Encounter (Signed)
Left message following up on visit with [facility] 

## 2018-11-29 DIAGNOSIS — L811 Chloasma: Secondary | ICD-10-CM | POA: Diagnosis not present

## 2018-11-29 DIAGNOSIS — L700 Acne vulgaris: Secondary | ICD-10-CM | POA: Diagnosis not present

## 2018-11-29 MED FILL — HYDROQUINONE 4 % CREA: 4 | 1 days supply | Qty: 28 | Fill #0

## 2020-02-24 ENCOUNTER — Ambulatory Visit (INDEPENDENT_AMBULATORY_CARE_PROVIDER_SITE_OTHER): Payer: 59 | Admitting: Nurse Practitioner

## 2020-02-24 ENCOUNTER — Other Ambulatory Visit: Payer: Self-pay

## 2020-02-24 DIAGNOSIS — U071 COVID-19: Secondary | ICD-10-CM

## 2020-02-24 DIAGNOSIS — R0602 Shortness of breath: Secondary | ICD-10-CM | POA: Diagnosis not present

## 2020-02-24 MED ORDER — ALBUTEROL SULFATE HFA 108 (90 BASE) MCG/ACT IN AERS
2.0000 | INHALATION_SPRAY | Freq: Four times a day (QID) | RESPIRATORY_TRACT | 0 refills | Status: DC | PRN
Start: 2020-02-24 — End: 2022-05-26

## 2020-02-24 MED FILL — ALBUTEROL SULFATE HFA 108 (: 108 (90 BAS | 25 days supply | Qty: 9 | Fill #0

## 2020-02-24 NOTE — Progress Notes (Signed)
Virtual Visit via Telephone Note  I connected with Jeanne Lamb on 02/24/20 at  2:45 PM EDT by telephone and verified that I am speaking with the correct person using two identifiers.   I discussed the limitations, risks, security and privacy concerns of performing an evaluation and management service by telephone and the availability of in person appointments. I also discussed with the patient that there may be a patient responsible charge related to this service. The patient expressed understanding and agreed to proceed.   History of Present Illness: Pt is a 39 year old female presenting at her home and provider at [facility] practice for a virtual telephone visit for sxs of SOB with increasing fatigue, except reportedly no more fever, chills that she has had continued but worsened since COVID testing positive on 08/25/202. She reports chest tightness like a band around her chest when she tries to take a deep breath. No CP, no acute resp. Distress/inability to breathe.    Observations/Objective: A/ox3, no cough heard, able to speak full sentences. Does sound slightly sob.   Past Medical History:  Diagnosis Date  . Anxiety   . Arthritis    joint pains  . Complication of anesthesia   . 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 Heart murmur   . History of chicken pox   . Insomnia   . PONV (postoperative nausea and vomiting)    Past Surgical History:  Procedure Laterality Date  . bilateral labial reduction    . CESAREAN SECTION    . CESAREAN SECTION N/A 10/08/2017   Procedure: REPEAT CESAREAN SECTION;  Surgeon: Tyson Dense, MD;  Location: [facility];  Service: Obstetrics;  Laterality: N/A;  Repeat edc 10/12/17 allergy to solodyn need RNFA   Current Outpatient Medications on File Prior to Visit  Medication Sig Dispense Refill  . acetaminophen (TYLENOL) 325 MG tablet Take 325 mg by mouth every 6 (six) hours as needed for moderate pain.     Marland Kitchen docusate  sodium (COLACE) 100 MG capsule Take 100-200 mg by mouth 2 (two) times daily as needed for mild constipation.    Marland Kitchen ibuprofen (ADVIL,MOTRIN) 600 MG tablet Take 1 tablet (600 mg total) by mouth every 6 (six) hours. 30 tablet 0  . oxyCODONE (OXY IR/ROXICODONE) 5 MG immediate release tablet Take 1 tablet (5 mg total) by mouth every 4 (four) hours as needed (pain scale 4-7). 30 tablet 0  . oxyCODONE (OXY IR/ROXICODONE) 5 MG immediate release tablet Take 1 tablet (5 mg total) by mouth every 4 (four) hours as needed for severe pain. 15 tablet 0  . Prenatal Vit-Fe Fumarate-FA (PRENATAL MULTIVITAMIN) TABS tablet Take 1 tablet by mouth at bedtime.     No current facility-administered medications on file prior to visit.   Immunization History  Administered Date(s) Administered  . DTaP 12/30/1981, 03/15/1982, 05/03/1982, 04/11/1983  . Hepatitis B 04/07/1995, 05/10/1995, 10/09/1995  . HiB (PRP-OMP) 12/30/1983  . IPV 12/30/1981, 05/03/1982, 04/11/1983  . Influenza Whole 04/07/2010, 03/05/2012  . Influenza, Seasonal, Injecte, Preservative Fre 04/11/2013  . Influenza,inj,Quad PF,6+ Mos 06/25/2014  . Influenza-Unspecified 04/17/2015  . MMR 01/03/1983  . Td 12/22/2009   Allergies  Allergen Reactions  . Minocycline Other (See Comments)    Inflammation, couldn't move, very painful in all joints      Assessment and Plan: SOB (shortness of breath) on exertion - Plan: albuterol (VENTOLIN HFA) 108 (90 Base) MCG/ACT inhaler  COVID-19 COVID tested on 02/16/2020, resulted  positive on 02/19/2020, Pt having all continued with worsening sxs of SOB with increasing fatigue, except reportedly no more fever, chills. PT INSTRUCTED TO GO TO THE ER FAMILY TO DRIVE HER DESIRE NOT TO CALL AMBULANCE. NOT IN ACUTE RESPIRATORY DISTRESS  May take OTC medications for sxs and vitamins for immune support Drink plenty of fluids.  Once recovered following CDC guidelines consider getting vaccinated Prevent the spread   Follow  Up Instructions:    I discussed the assessment and treatment plan with the patient. The patient was provided an opportunity to ask questions and all were answered. The patient agreed with the plan and demonstrated an understanding of the instructions.   The patient was advised to call back or seek an in-person evaluation if the symptoms worsen or if the condition fails to improve as anticipated.  I provided 15 minutes of non-face-to-face time during this encounter. 3:00 PM  Annie Main, FNP

## 2021-05-25 ENCOUNTER — Telehealth: Payer: Self-pay

## 2021-05-25 NOTE — Telephone Encounter (Signed)
NOTES SCANNED TO REFERRAL 

## 2021-06-25 ENCOUNTER — Encounter: Payer: Self-pay | Admitting: Internal Medicine

## 2021-06-25 ENCOUNTER — Ambulatory Visit (INDEPENDENT_AMBULATORY_CARE_PROVIDER_SITE_OTHER): Payer: 59 | Admitting: Internal Medicine

## 2021-06-25 ENCOUNTER — Other Ambulatory Visit: Payer: Self-pay

## 2021-06-25 VITALS — BP 122/73 | HR 82 | Ht 65.0 in | Wt 169.2 lb

## 2021-06-25 DIAGNOSIS — R011 Cardiac murmur, unspecified: Secondary | ICD-10-CM | POA: Diagnosis not present

## 2021-06-25 DIAGNOSIS — Z8249 Family history of ischemic heart disease and other diseases of the circulatory system: Secondary | ICD-10-CM

## 2021-06-25 DIAGNOSIS — R0600 Dyspnea, unspecified: Secondary | ICD-10-CM | POA: Diagnosis not present

## 2021-06-25 DIAGNOSIS — R42 Dizziness and giddiness: Secondary | ICD-10-CM

## 2021-06-25 NOTE — Progress Notes (Signed)
OFFICE CONSULT NOTE  Chief Complaint:  Shortness of breath, chest tightness, dizziness    Primary Care Physician: Jeanne Rossetti, MD  HPI:  Jeanne Lamb is a 40 y.o. female who is being seen today for the evaluation of shortness of breath, chest tightness, dizziness at the request of Lamb, Jeanne B, NP.  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  She reports being more sedentary but recently has had some chest tightness, shortness of breath with exertion and positional dizziness, per tickly when bending over.  Family history significant for mother who had heart failure and hypertrophic cardiomyopathy.  She was seen in the past I believe by Dr. Rollene Lamb and underwent an echocardiogram around 2013 which showed some mild mitral regurgitation but otherwise no significant findings.  She is concerned about her risk of coronary disease.  She describes some tightness in her chest which is intermittent and feels like squeezing.  She gets shortness of breath mostly when walking upstairs.  As mentioned her dizziness is mostly positional when bending over or getting up too quickly.  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   Heart murmur    History of chicken pox    Insomnia    PONV (postoperative nausea and vomiting)     Past Surgical History:  Procedure Laterality Date   bilateral labial reduction     CESAREAN SECTION     CESAREAN SECTION N/A 10/08/2017   Procedure: REPEAT CESAREAN SECTION;  Surgeon: Jeanne Dense, MD;  Location: [facility];  Service: Obstetrics;  Laterality: N/A;  Repeat edc 10/12/17 allergy to solodyn need RNFA    FAMHx:  Family History  Problem Relation Age of Onset   Heart disease Mother        MI   Hypertension Mother    COPD Mother    Cancer  Maternal Grandmother        Hodgkins Dz    SOCHx:   reports that she has never smoked. She has never used smokeless tobacco. She reports that she does not drink alcohol and does not use drugs.  ALLERGIES:  Allergies  Allergen Reactions   Minocycline Other (See Comments)    Inflammation, couldn't move, very painful in all joints     ROS: Pertinent items noted in HPI and remainder of comprehensive ROS otherwise negative.  HOME MEDS: Current Outpatient Medications on File Prior to Visit  Medication Sig Dispense Refill   aspirin 325 MG tablet Take 325 mg by mouth daily.     Multiple Vitamin (MULTIVITAMIN ADULT) TABS multivitamin     Omega-3 Fatty Acids (FISH OIL) 1000 MG CAPS Fish Oil     acetaminophen (TYLENOL) 325 MG tablet Take 325 mg by mouth every 6 (six) hours as needed for moderate pain.  (Patient not taking: Reported on 06/25/2021)     albuterol (VENTOLIN HFA) 108 (90 Base) MCG/ACT inhaler Inhale 2 puffs into the lungs every 6 (six) hours as needed for wheezing or shortness of breath. (Patient not taking: Reported on 06/25/2021) 8 g 0   docusate sodium (COLACE) 100 MG capsule Take 100-200 mg by mouth 2 (two) times daily as needed for mild constipation. (Patient not taking: Reported on 06/25/2021)     ibuprofen (ADVIL,MOTRIN)  600 MG tablet Take 1 tablet (600 mg total) by mouth every 6 (six) hours. (Patient not taking: Reported on 06/25/2021) 30 tablet 0   oxyCODONE (OXY IR/ROXICODONE) 5 MG immediate release tablet Take 1 tablet (5 mg total) by mouth every 4 (four) hours as needed (pain scale 4-7). (Patient not taking: Reported on 06/25/2021) 30 tablet 0   oxyCODONE (OXY IR/ROXICODONE) 5 MG immediate release tablet Take 1 tablet (5 mg total) by mouth every 4 (four) hours as needed for severe pain. (Patient not taking: Reported on 06/25/2021) 15 tablet 0   Prenatal Vit-Fe Fumarate-FA (PRENATAL MULTIVITAMIN) TABS tablet Take 1 tablet by mouth at bedtime. (Patient not taking: Reported on  06/25/2021)     No current facility-administered medications on file prior to visit.    LABS/IMAGING: No results found for this or any previous visit (from the past 48 hour(s)). No results found.  LIPID PANEL:    Component Value Date/Time   CHOL 139 05/29/2015 0821   TRIG 35 05/29/2015 0821   HDL 57 05/29/2015 0821   CHOLHDL 2.4 05/29/2015 0821   VLDL 7 05/29/2015 0821   LDLCALC 75 05/29/2015 0821    WEIGHTS: Wt Readings from Last 3 Encounters:  06/25/21 169 lb 3.2 oz (76.7 kg)  07/16/18 149 lb (67.6 kg)  10/08/17 168 lb (76.2 kg)    VITALS: BP 122/73    Pulse 82    Ht 5\' 5"  (1.651 m)    Wt 169 lb 3.2 oz (76.7 kg)    SpO2 98%    BMI 28.16 kg/m   EXAM: General appearance: alert and no distress Neck: no JVD, supple, symmetrical, trachea midline, and thyroid not enlarged, symmetric, no tenderness/mass/nodules Lungs: clear to auscultation bilaterally Heart: regular rate and rhythm, S1, S2 normal, no murmur, click, rub or gallop Abdomen: soft, non-tender; bowel sounds normal; no masses,  no organomegaly Extremities: extremities normal, atraumatic, no cyanosis or edema Pulses: 2+ and symmetric Skin: Skin color, texture, turgor normal. No rashes or lesions Neurologic: Grossly normal Psych: Pleasant  *Examination chaperoned by Jeanne Apley, RN  EKG: Normal sinus rhythm at 82- personally reviewed  ASSESSMENT: Dyspnea on exertion/chest tightness/dizziness Family history of heart failure/HOCM Mitral regurgitation  PLAN: 1.   Ms. [name] has been having some symptoms of dyspnea on exertion with chest tightness and dizziness.  Symptoms are sporadic and not necessarily associated with exertion or relieved by rest.  I like to get a calcium score to rule stratify her and will get an echocardiogram to evaluate her mitral regurgitation as well as because of her history of hypertrophic cardiomyopathy.  She has had recent increase in her cholesterol associated with weight gain and  more sedentary lifestyle.  She started to exercise more work to get the weight down.  This will certainly help with her cholesterol numbers.  We will contact her with results of the studies and follow-up afterwards as necessary.  Thanks again for the kind referral  Pixie Casino, MD, FACC, [address] Director of the [facility]  Card[facility] Diplomate of the American Board of Clinical Lipidology Attending Cardiologist  Direct Dial: [phone]   Fax: [phone]  Website:  [email or web address] 06/25/2021, 12:58 PM

## 2021-06-25 NOTE — Patient Instructions (Signed)
Medication Instructions:  NO CHANGES  *If you need a refill on your cardiac medications before your next appointment, please call your pharmacy*  Testing/Procedures: Echocardiogram and Calcium Score at [facility]  [place] [address]   Follow-Up: At [facility], you and your health needs are our priority.  As part of our continuing mission to provide you with exceptional heart care, we have created designated Provider Care Teams.  These Care Teams include your primary Cardiologist (physician) and Advanced Practice Providers (APPs -  Physician Assistants and Nurse Practitioners) who all work together to provide you with the care you need, when you need it.  We recommend signing up for the patient portal called "MyChart".  Sign up information is provided on this After Visit Summary.  MyChart is used to connect with patients for Virtual Visits (Telemedicine).  Patients are able to view lab/test results, encounter notes, upcoming appointments, etc.  Non-urgent messages can be sent to your provider as well.   To learn more about what you can do with MyChart, go to [email or web address].    Your next appointment:   AS NEEDED with Dr. Debara Pickett

## 2021-07-12 ENCOUNTER — Telehealth: Payer: Self-pay | Admitting: Internal Medicine

## 2021-07-12 NOTE — Telephone Encounter (Signed)
Calcium score appt pending, per notes below:    From: Livingston Diones, RT Sent: 07/09/2021   2:38 PM EST To: Philbert Riser Subject: RE: REFERRAL                                    PT stated that her insurance did not cover the visit to see Dr. Debara Pickett. Pt stated that she knows the calcium score will not be covered by the insurance, and wants to contact her insurance and see if the Echo will be covered. If the Echo is not covered, she isn't interested in any of the scans as she doesn't have the money right now to cover them. Once she reached her insurance company next week, she will let me know for sure.        ----- Message ----- From: Philbert Riser Sent: 07/08/2021   1:49 PM EST To: Fidel Levy, RN, Livingston Diones, RT Subject: Jeanne Lamb,  Please review for scheduling.   Thanks

## 2021-07-23 ENCOUNTER — Other Ambulatory Visit (HOSPITAL_COMMUNITY): Payer: 59

## 2021-07-28 ENCOUNTER — Other Ambulatory Visit: Payer: 59

## 2021-08-12 ENCOUNTER — Ambulatory Visit: Admission: RE | Admit: 2021-08-12 | Payer: 59 | Source: Ambulatory Visit

## 2022-05-26 ENCOUNTER — Other Ambulatory Visit (HOSPITAL_COMMUNITY)
Admission: RE | Admit: 2022-05-26 | Discharge: 2022-05-26 | Disposition: A | Discharge status: Home / Self Care | Payer: No Typology Code available for payment source | Source: Ambulatory Visit | Attending: Radiology | Admitting: Radiology

## 2022-05-26 ENCOUNTER — Encounter: Payer: Self-pay | Admitting: Radiology

## 2022-05-26 ENCOUNTER — Ambulatory Visit (INDEPENDENT_AMBULATORY_CARE_PROVIDER_SITE_OTHER): Payer: No Typology Code available for payment source | Admitting: Radiology

## 2022-05-26 VITALS — BP 116/78 | Ht 65.0 in | Wt 164.0 lb

## 2022-05-26 DIAGNOSIS — R87610 Atypical squamous cells of undetermined significance on cytologic smear of cervix (ASC-US): Secondary | ICD-10-CM

## 2022-05-26 DIAGNOSIS — R87810 Cervical high risk human papillomavirus (HPV) DNA test positive: Secondary | ICD-10-CM

## 2022-05-26 DIAGNOSIS — Z01419 Encounter for gynecological examination (general) (routine) without abnormal findings: Secondary | ICD-10-CM

## 2022-05-26 NOTE — Progress Notes (Signed)
Jeanne Lamb 08-05-1980 [id number]   History:  41 y.o. G4P3 presents for annual exam, transfer from [facility]. 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  Gynecologic History Patient's last menstrual period was 04/27/2022 (exact date). Period Cycle (Days): 28 Period Duration (Days): 7 Period Pattern: Regular Menstrual Flow: Moderate (moderate to heavy) Menstrual Control: Tampon, Maxi pad Dysmenorrhea: (!) Moderate Dysmenorrhea Symptoms: Cramping Contraception/Family planning: abstinence Sexually active: no Last Pap: 2022. Results were: normal, 2019 ASCUS HPV + Last mammogram: never.   Obstetric History OB History  Gravida Para Term Preterm AB Living  '4 3 3   1 3  '$ SAB IAB Ectopic Multiple Live Births  1     0 3    # Outcome Date GA Lbr Len/2nd Weight Sex Delivery Anes PTL Lv  4 Term 10/08/17 [redacted]w[redacted]d 9 lb 6.8 oz (4.275 kg) M CS-LTranv Spinal  LIV     Birth Comments: abdominal distension;right sacral dimple-unable to visualize base; bilateral hydroceles  3 Term 2011 [id number] lb 10 oz (4.366 kg) M CS-LTranv   LIV     Birth Comments: dehiscense  2 Term 2008 [id number] lb 13 oz (4.451 kg) M CS-LTranv EPI, Gen  LIV  1 SAB              The following portions of the patient's history were reviewed and updated as appropriate: allergies, current medications, past family history, past medical history, past social history, past surgical history, and problem list.  Review of Systems Pertinent items noted in HPI and remainder of comprehensive ROS otherwise negative.   Past medical history, past surgical history, family history and social history were all reviewed and documented in the EPIC chart.   Exam:  Vitals:   05/26/22 0921  BP: 116/78  Weight: 164 lb (74.4 kg)  Height: '5\' 5"'$  (1.651 m)   Body mass index is 27.29 kg/m.  General appearance:  Normal Thyroid:  Symmetrical, normal in size, without palpable masses or nodularity. Respiratory  Auscultation:   Clear without wheezing or rhonchi Cardiovascular  Auscultation:  Regular rate, without rubs, murmurs or gallops  Edema/varicosities:  Not grossly evident Abdominal  Soft,nontender, without masses, guarding or rebound.  Liver/spleen:  No organomegaly noted  Hernia:  None appreciated  Skin  Inspection:  Grossly normal Breasts: Examined lying and sitting.   Right: Without masses, retractions, nipple discharge or axillary adenopathy.   Left: Without masses, retractions, nipple discharge or axillary adenopathy. Genitourinary   Inguinal/mons:  Normal without inguinal adenopathy  External genitalia:  Normal appearing vulva with no masses, tenderness, or lesions  BUS/Urethra/Skene's glands:  Normal without masses or exudate  Vagina:  Normal appearing with normal color and discharge, no lesions  Cervix:  Normal appearing without discharge or lesions  Uterus:  Normal in size, shape and contour.  Mobile, nontender  Adnexa/parametria:     Rt: Normal in size, without masses or tenderness.   Lt: Normal in size, without masses or tenderness.  Anus and perineum: Normal   Patient informed chaperone available to be present for breast and pelvic exam. Patient has requested no chaperone to be present. Patient has been advised what will be completed during breast and pelvic exam.   Assessment/Plan:   1. Well woman exam with routine gynecological exam - [facility] [employer] - Increase weight bearing exercise and protein to help with weight loss  2. 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 Recommend 131mns of exercise weekly, including weight bearing exercise. Encouraged the use of seatbelts and sunscreen. Return in 1 year for annual or as needed.    CRubbie BattiestB WHNP-BC 10:26 AM 05/26/2022

## 2022-05-27 LAB — CYTOLOGY - PAP
Comment: NEGATIVE
Diagnosis: NEGATIVE
High risk HPV: NEGATIVE

## 2022-06-03 ENCOUNTER — Encounter: Payer: No Typology Code available for payment source | Admitting: Radiology

## 2023-07-11 ENCOUNTER — Other Ambulatory Visit: Payer: Self-pay | Admitting: Obstetrics & Gynecology

## 2023-07-11 DIAGNOSIS — R928 Other abnormal and inconclusive findings on diagnostic imaging of breast: Secondary | ICD-10-CM

## 2023-08-11 ENCOUNTER — Ambulatory Visit
Admission: RE | Admit: 2023-08-11 | Discharge: 2023-08-11 | Disposition: A | Discharge status: Home / Self Care | Payer: 59 | Source: Ambulatory Visit | Attending: Obstetrics & Gynecology | Admitting: Obstetrics & Gynecology

## 2023-08-11 ENCOUNTER — Other Ambulatory Visit: Payer: Self-pay | Admitting: Obstetrics & Gynecology

## 2023-08-11 DIAGNOSIS — N6320 Unspecified lump in the left breast, unspecified quadrant: Secondary | ICD-10-CM

## 2023-08-11 DIAGNOSIS — R928 Other abnormal and inconclusive findings on diagnostic imaging of breast: Secondary | ICD-10-CM

## 2023-08-16 ENCOUNTER — Other Ambulatory Visit: Payer: 59

## 2023-08-23 ENCOUNTER — Ambulatory Visit
Admission: RE | Admit: 2023-08-23 | Discharge: 2023-08-23 | Disposition: A | Discharge status: Home / Self Care | Payer: 59 | Source: Ambulatory Visit | Attending: Obstetrics & Gynecology | Admitting: Obstetrics & Gynecology

## 2023-08-23 ENCOUNTER — Other Ambulatory Visit: Payer: Self-pay | Admitting: Obstetrics & Gynecology

## 2023-08-23 DIAGNOSIS — N6320 Unspecified lump in the left breast, unspecified quadrant: Secondary | ICD-10-CM

## 2023-08-23 DIAGNOSIS — R928 Other abnormal and inconclusive findings on diagnostic imaging of breast: Secondary | ICD-10-CM
